# Patient Record
Sex: Male | Born: 1950 | Race: White | Hispanic: No | Marital: Married | Smoking: Former smoker
Health system: Southern US, Community
[De-identification: ages and names within clinical notes are randomized; demographics above are authoritative.]

## PROBLEM LIST (undated history)

## (undated) DIAGNOSIS — Z809 Family history of malignant neoplasm, unspecified: Secondary | ICD-10-CM

## (undated) DIAGNOSIS — N419 Inflammatory disease of prostate, unspecified: Secondary | ICD-10-CM

## (undated) DIAGNOSIS — M545 Low back pain, unspecified: Secondary | ICD-10-CM

## (undated) DIAGNOSIS — J302 Other seasonal allergic rhinitis: Secondary | ICD-10-CM

## (undated) DIAGNOSIS — K589 Irritable bowel syndrome without diarrhea: Secondary | ICD-10-CM

## (undated) DIAGNOSIS — G4733 Obstructive sleep apnea (adult) (pediatric): Secondary | ICD-10-CM

## (undated) DIAGNOSIS — G8929 Other chronic pain: Secondary | ICD-10-CM

## (undated) DIAGNOSIS — K449 Diaphragmatic hernia without obstruction or gangrene: Secondary | ICD-10-CM

## (undated) DIAGNOSIS — T8182XA Emphysema (subcutaneous) resulting from a procedure, initial encounter: Secondary | ICD-10-CM

## (undated) DIAGNOSIS — H409 Unspecified glaucoma: Secondary | ICD-10-CM

## (undated) DIAGNOSIS — K760 Fatty (change of) liver, not elsewhere classified: Secondary | ICD-10-CM

## (undated) DIAGNOSIS — H9190 Unspecified hearing loss, unspecified ear: Secondary | ICD-10-CM

## (undated) DIAGNOSIS — K5790 Diverticulosis of intestine, part unspecified, without perforation or abscess without bleeding: Secondary | ICD-10-CM

## (undated) HISTORY — DX: Other seasonal allergic rhinitis: J30.2

## (undated) HISTORY — DX: Fatty (change of) liver, not elsewhere classified: K76.0

## (undated) HISTORY — DX: Low back pain: M54.5

## (undated) HISTORY — DX: Other chronic pain: G89.29

## (undated) HISTORY — DX: Inflammatory disease of prostate, unspecified: N41.9

## (undated) HISTORY — DX: Unspecified glaucoma: H40.9

## (undated) HISTORY — DX: Family history of malignant neoplasm, unspecified: Z80.9

## (undated) HISTORY — DX: Obstructive sleep apnea (adult) (pediatric): G47.33

## (undated) HISTORY — PX: OTHER SURGICAL HISTORY: SHX169

## (undated) HISTORY — DX: Diverticulosis of intestine, part unspecified, without perforation or abscess without bleeding: K57.90

## (undated) HISTORY — DX: Low back pain, unspecified: M54.50

## (undated) HISTORY — DX: Irritable bowel syndrome, unspecified: K58.9

## (undated) HISTORY — DX: Diaphragmatic hernia without obstruction or gangrene: K44.9

## (undated) HISTORY — DX: Emphysema (subcutaneous) resulting from a procedure, initial encounter: T81.82XA

## (undated) HISTORY — DX: Unspecified hearing loss, unspecified ear: H91.90

---

## 2012-10-13 HISTORY — PX: COLONOSCOPY: SHX5424

## 2013-09-18 DIAGNOSIS — IMO0002 Reserved for concepts with insufficient information to code with codable children: Secondary | ICD-10-CM | POA: Insufficient documentation

## 2013-09-18 DIAGNOSIS — H4423 Degenerative myopia, bilateral: Secondary | ICD-10-CM | POA: Insufficient documentation

## 2014-12-28 ENCOUNTER — Institutional Professional Consult (permissible substitution): Payer: BLUE CROSS/BLUE SHIELD | Admitting: Neurology

## 2014-12-28 ENCOUNTER — Telehealth: Payer: Self-pay

## 2014-12-28 NOTE — Telephone Encounter (Signed)
Dr. Brett Fairy out of office today. Called pt to r/s. Appt made 01/19/15 at 9:30 with pt.

## 2015-01-19 ENCOUNTER — Institutional Professional Consult (permissible substitution): Payer: Self-pay | Admitting: Neurology

## 2015-02-14 ENCOUNTER — Other Ambulatory Visit: Payer: Self-pay | Admitting: Internal Medicine

## 2015-02-14 DIAGNOSIS — R918 Other nonspecific abnormal finding of lung field: Secondary | ICD-10-CM

## 2015-02-14 DIAGNOSIS — K588 Other irritable bowel syndrome: Secondary | ICD-10-CM

## 2015-02-14 DIAGNOSIS — N281 Cyst of kidney, acquired: Secondary | ICD-10-CM

## 2015-02-21 ENCOUNTER — Other Ambulatory Visit: Payer: Self-pay

## 2015-02-23 ENCOUNTER — Inpatient Hospital Stay: Admission: RE | Admit: 2015-02-23 | Payer: Self-pay | Source: Ambulatory Visit

## 2015-02-23 ENCOUNTER — Ambulatory Visit
Admission: RE | Admit: 2015-02-23 | Discharge: 2015-02-23 | Disposition: A | Discharge status: Home / Self Care | Payer: BLUE CROSS/BLUE SHIELD | Source: Ambulatory Visit | Attending: Internal Medicine | Admitting: Internal Medicine

## 2015-02-23 DIAGNOSIS — K588 Other irritable bowel syndrome: Secondary | ICD-10-CM

## 2015-02-23 DIAGNOSIS — N281 Cyst of kidney, acquired: Secondary | ICD-10-CM

## 2015-02-28 ENCOUNTER — Other Ambulatory Visit: Payer: Self-pay | Admitting: Internal Medicine

## 2015-02-28 DIAGNOSIS — R918 Other nonspecific abnormal finding of lung field: Secondary | ICD-10-CM

## 2015-03-01 ENCOUNTER — Ambulatory Visit
Admission: RE | Admit: 2015-03-01 | Discharge: 2015-03-01 | Disposition: A | Discharge status: Home / Self Care | Payer: BLUE CROSS/BLUE SHIELD | Source: Ambulatory Visit | Attending: Internal Medicine | Admitting: Internal Medicine

## 2015-03-01 ENCOUNTER — Other Ambulatory Visit: Payer: Self-pay | Admitting: Internal Medicine

## 2015-03-01 ENCOUNTER — Inpatient Hospital Stay
Admission: RE | Admit: 2015-03-01 | Discharge: 2015-03-01 | Disposition: A | Discharge status: Home / Self Care | Payer: Self-pay | Source: Ambulatory Visit | Attending: Internal Medicine | Admitting: Internal Medicine

## 2015-03-01 DIAGNOSIS — R918 Other nonspecific abnormal finding of lung field: Secondary | ICD-10-CM

## 2015-11-11 DIAGNOSIS — H401132 Primary open-angle glaucoma, bilateral, moderate stage: Secondary | ICD-10-CM | POA: Insufficient documentation

## 2016-02-16 ENCOUNTER — Other Ambulatory Visit: Payer: Self-pay | Admitting: Internal Medicine

## 2016-02-16 DIAGNOSIS — E041 Nontoxic single thyroid nodule: Secondary | ICD-10-CM

## 2016-02-22 ENCOUNTER — Ambulatory Visit
Admission: RE | Admit: 2016-02-22 | Discharge: 2016-02-22 | Disposition: A | Discharge status: Home / Self Care | Payer: BLUE CROSS/BLUE SHIELD | Source: Ambulatory Visit | Attending: Internal Medicine | Admitting: Internal Medicine

## 2016-02-22 DIAGNOSIS — E041 Nontoxic single thyroid nodule: Secondary | ICD-10-CM

## 2016-02-26 ENCOUNTER — Other Ambulatory Visit: Payer: Self-pay | Admitting: Surgery

## 2016-02-26 ENCOUNTER — Ambulatory Visit: Payer: Self-pay | Admitting: Surgery

## 2016-02-26 DIAGNOSIS — E041 Nontoxic single thyroid nodule: Secondary | ICD-10-CM

## 2016-02-26 NOTE — H&P (Signed)
Mark Travis 02/26/2016 11:03 AM Location: [facility] Patient #: [id number] DOB: 12-18-1950 Married / Language: Mark Travis / Race: White Male  History of Present Illness Mark Travis A. Mark Bodmer MD; 02/26/2016 12:24 PM) Patient words: thyroid mass and sebaceous cyst on neck Patient sent at the request of Dr. Rolena Travis for a lump on the patient's posterior neck. He was seen for neck pain and underwent a CT scan which showed a sebaceous cyst on his posterior neck and a 2 cm mass in the isthmus of the thyroid gland. He underwent ultrasound of his thyroid gland which shows a 2.6 cm mass in the isthmus the thyroid. FNA was recommended. Denies any hoarseness, difficulty swallowing, dysphagia or globus. The cyst on his posterior neck is potential for getting larger. Denies any redness or drainage from it. It's been present for over a year.             CLINICAL DATA: Incidental on MRI.  EXAM: THYROID ULTRASOUND  TECHNIQUE: Ultrasound examination of the thyroid gland and adjacent soft tissues was performed.  COMPARISON: None.  FINDINGS: Parenchymal Echotexture: Mildly heterogenous  Estimated total number of nodules >/= 1 cm: 2  Number of spongiform nodules >/= 2 cm not described below (TR1): 0  Number of mixed cystic and solid nodules >/= 1.5 cm not described below ([place]): 0  _________________________________________________________  Isthmus: 0.8 cm  Nodule # 1:  Location: Isthmus; Inferior  Size: 2.6 x 1.7 x 2.2 cm  Composition: solid/almost completely solid (2)  Echogenicity: isoechoic (1)  Shape: not taller-than-wide (0)  Margins: smooth (0)  Echogenic foci: none (0)  ACR TI-RADS total points: 3.  ACR TI-RADS risk category: TR3 (3 points).  ACR TI-RADS recommendations:  **Given size (>/= 2.5 cm) and appearance, fine needle aspiration of this mildly suspicious nodule should be considered based on  TI-RADS criteria.  _________________________________________________________  Right lobe: 5.5 x 1.8 x 2.1 cm  Nodule # 2:  Location: Right; Inferior  Size: 2.0 x 1.9 x 1.8 cm  Composition: solid/almost completely solid (2)  Echogenicity: isoechoic (1)  Shape: not taller-than-wide (0)  Margins: smooth (0)  Echogenic foci: none (0)  ACR TI-RADS total points: 3.  ACR TI-RADS risk category: TR3 (3 points).  ACR TI-RADS recommendations:  *Given size (>/= 1.5 - 2.4 cm) and appearance, a follow-up ultrasound in 1 year should be considered based on TI-RADS criteria.  Sub cm nodules have a benign appearance.  _________________________________________________________  Left lobe: 4.7 x 1.7 x 2.1 cm  0.8 cm lower pole cyst has a benign appearance.  IMPRESSION: 2.6 cm nodule in the isthmus. Fine-needle aspiration biopsy is recommended  2.0 cm right lower pole nodule. Annual follow-up is recommended.  The above is in keeping with the ACR TI-RADS recommendations - J Am Coll Radiol 2017;14:587-595.   Electronically Signed By: Mark Travis M.D. On: 02/22/2016 15:20.  The patient is a 65 year old male.   Other Problems Mark Travis, Mark Travis; 02/26/2016 11:03 AM) Arthritis Back Pain Diabetes Mellitus Diverticulosis  Past Surgical History Mark Travis, [place]; 02/26/2016 11:03 AM) No pertinent past surgical history  Diagnostic Studies History Mark Travis, Mark Travis; 02/26/2016 11:03 AM) Colonoscopy 1-5 years ago  Allergies Mark Travis, Mark Travis; 02/26/2016 11:04 AM) No Known Drug Allergies 02/26/2016  Medication History (Mark Travis, Mark Travis; 02/26/2016 11:05 AM) Timolol Maleate (0.5% Solution, Ophthalmic) Active. Nortriptyline HCl (25MG  Capsule, Oral) Active. MetFORMIN HCl ER (500MG  Tablet ER 24HR, Oral) Active. TiZANidine HCl (4MG  Tablet, Oral) Active. Ciprofloxacin HCl (500MG  Tablet, Oral) Active. Probiotic Childrens (  Oral) Active. B Complex 50 (Oral)  Active. Medications Reconciled  Social History (Mark Travis; 02/26/2016 11:03 AM) Alcohol use Occasional alcohol use. Caffeine use Coffee. No drug use Tobacco use Current every day smoker.  Family History Mark Travis, Mark Travis; 02/26/2016 11:03 AM) Arthritis Mother. Heart Disease Mother. Heart disease in male family member before age 23 Hypertension Mother.     Review of Systems Mark Travis Mark Travis; 02/26/2016 11:03 AM) General Not Present- Appetite Loss, Chills, Fatigue, Fever, Night Sweats, Weight Gain and Weight Loss. Skin Not Present- Change in Wart/Mole, Dryness, Hives, Jaundice, New Lesions, Non-Healing Wounds, Rash and Ulcer. HEENT Present- Hearing Loss, Ringing in the Ears, Seasonal Allergies and Wears glasses/contact lenses. Not Present- Earache, Hoarseness, Nose Bleed, Oral Ulcers, Sinus Pain, Sore Throat, Visual Disturbances and Yellow Eyes. Respiratory Present- Snoring. Not Present- Bloody sputum, Chronic Cough, Difficulty Breathing and Wheezing. Breast Not Present- Breast Mass, Breast Pain, Nipple Discharge and Skin Changes. Cardiovascular Not Present- Chest Pain, Difficulty Breathing Lying Down, Leg Cramps, Palpitations, Rapid Heart Rate, Shortness of Breath and Swelling of Extremities. Gastrointestinal Not Present- Abdominal Pain, Bloating, Bloody Stool, Change in Bowel Habits, Chronic diarrhea, Constipation, Difficulty Swallowing, Excessive gas, Gets full quickly at meals, Hemorrhoids, Indigestion, Nausea, Rectal Pain and Vomiting. Male Genitourinary Not Present- Blood in Urine, Change in Urinary Stream, Frequency, Impotence, Nocturia, Painful Urination, Urgency and Urine Leakage. Musculoskeletal Present- Back Pain, Joint Pain, Joint Stiffness and Muscle Weakness. Not Present- Muscle Pain and Swelling of Extremities. Neurological Present- Tingling and Trouble walking. Not Present- Decreased Memory, Fainting, Headaches, Numbness, Seizures, Tremor and  Weakness. Psychiatric Not Present- Anxiety, Bipolar, Change in Sleep Pattern, Depression, Fearful and Frequent crying. Endocrine Present- New Diabetes. Not Present- Cold Intolerance, Excessive Hunger, Hair Changes, Heat Intolerance and Hot flashes. Hematology Not Present- Blood Thinners, Easy Bruising, Excessive bleeding, Gland problems, HIV and Persistent Infections.  Vitals (Mark Travis Mark Travis; 02/26/2016 11:04 AM) 02/26/2016 11:04 AM Weight: 190 lb Height: 70in Body Surface Area: 2.04 m Body Mass Index: 27.26 kg/m  Temp.: 41F(Temporal)  Pulse: 75 (Regular)  BP: 124/80 (Sitting, Left Arm, Standard)      Physical Exam (Ying Blankenhorn A. Sherrilyn Nairn MD; 02/26/2016 12:25 PM)  General Mental Status-Alert. General Appearance-Consistent with stated age. Hydration-Well hydrated. Voice-Normal.  Integumentary Note: 3 cm mass mobile posterior neck consistent with epidermal inclusion cyst. Not infected.  ENMT Note: Thyroid normal size. No dominant mass. Trachea midline. Voice is normal.  Chest and Lung Exam Chest and lung exam reveals -quiet, even and easy respiratory effort with no use of accessory muscles and on auscultation, normal breath sounds, no adventitious sounds and normal vocal resonance. Inspection Chest Wall - Normal. Back - normal.  Cardiovascular Cardiovascular examination reveals -normal heart sounds, regular rate and rhythm with no murmurs and normal pedal pulses bilaterally.  Musculoskeletal Normal Exam - Left-Upper Extremity Strength Normal and Lower Extremity Strength Normal. Normal Exam - Right-Upper Extremity Strength Normal and Lower Extremity Strength Normal.  Lymphatic Head & Neck  General Head & Neck Lymphatics: Bilateral - Description - Normal.    Assessment & Plan (Hydee Fleece A. 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  You should hear from our office's scheduling department  within 5 working days about the location, date, and time of surgery. We try to make accommodations for patient's preferences in scheduling surgery, but sometimes the OR schedule or the surgeon's schedule prevents [place] from making those accommodations.  If you have not heard from our office [phone]) in 5 working days, call the office and ask for your surgeon's nurse.  If you have other questions about your diagnosis, plan, or surgery, call the office and ask for your surgeon's nurse.  The pathophysiology of skin & subcutaneous masses was discussed. Natural history risks without surgery were discussed. I recommended surgery to remove the mass. I explained the technique of removal with use of local anesthesia & possible need for more aggressive sedation/anesthesia for patient comfort.  Risks such as bleeding, infection, wound breakdown, heart attack, death, and other risks were discussed. I noted a good likelihood this will help address the problem. Possibility that this will not correct all symptoms was explained. Possibility of regrowth/recurrence of the mass was discussed. We will work to minimize complications. Questions were answered. The patient expresses understanding & wishes to proceed with surgery.

## 2016-03-12 ENCOUNTER — Other Ambulatory Visit (HOSPITAL_COMMUNITY)
Admission: RE | Admit: 2016-03-12 | Discharge: 2016-03-12 | Disposition: A | Discharge status: Home / Self Care | Payer: BLUE CROSS/BLUE SHIELD | Source: Ambulatory Visit | Attending: Radiology | Admitting: Radiology

## 2016-03-12 ENCOUNTER — Ambulatory Visit
Admission: RE | Admit: 2016-03-12 | Discharge: 2016-03-12 | Disposition: A | Discharge status: Home / Self Care | Payer: BLUE CROSS/BLUE SHIELD | Source: Ambulatory Visit | Attending: Surgery | Admitting: Surgery

## 2016-03-12 DIAGNOSIS — E041 Nontoxic single thyroid nodule: Secondary | ICD-10-CM

## 2017-03-20 DIAGNOSIS — R141 Gas pain: Secondary | ICD-10-CM | POA: Diagnosis not present

## 2017-03-27 DIAGNOSIS — E1151 Type 2 diabetes mellitus with diabetic peripheral angiopathy without gangrene: Secondary | ICD-10-CM | POA: Diagnosis not present

## 2017-03-27 DIAGNOSIS — R82998 Other abnormal findings in urine: Secondary | ICD-10-CM | POA: Diagnosis not present

## 2017-03-27 DIAGNOSIS — E041 Nontoxic single thyroid nodule: Secondary | ICD-10-CM | POA: Diagnosis not present

## 2017-03-27 DIAGNOSIS — E7849 Other hyperlipidemia: Secondary | ICD-10-CM | POA: Diagnosis not present

## 2017-03-27 DIAGNOSIS — Z125 Encounter for screening for malignant neoplasm of prostate: Secondary | ICD-10-CM | POA: Diagnosis not present

## 2017-03-31 DIAGNOSIS — M222X1 Patellofemoral disorders, right knee: Secondary | ICD-10-CM | POA: Diagnosis not present

## 2017-03-31 DIAGNOSIS — Z0000 Encounter for general adult medical examination without abnormal findings: Secondary | ICD-10-CM | POA: Diagnosis not present

## 2017-03-31 DIAGNOSIS — I2510 Atherosclerotic heart disease of native coronary artery without angina pectoris: Secondary | ICD-10-CM | POA: Diagnosis not present

## 2017-03-31 DIAGNOSIS — E7849 Other hyperlipidemia: Secondary | ICD-10-CM | POA: Diagnosis not present

## 2017-03-31 DIAGNOSIS — E1151 Type 2 diabetes mellitus with diabetic peripheral angiopathy without gangrene: Secondary | ICD-10-CM | POA: Diagnosis not present

## 2017-03-31 DIAGNOSIS — R972 Elevated prostate specific antigen [PSA]: Secondary | ICD-10-CM | POA: Diagnosis not present

## 2017-03-31 DIAGNOSIS — D569 Thalassemia, unspecified: Secondary | ICD-10-CM | POA: Diagnosis not present

## 2017-03-31 DIAGNOSIS — M545 Low back pain: Secondary | ICD-10-CM | POA: Diagnosis not present

## 2017-03-31 DIAGNOSIS — M542 Cervicalgia: Secondary | ICD-10-CM | POA: Diagnosis not present

## 2017-03-31 DIAGNOSIS — M222X2 Patellofemoral disorders, left knee: Secondary | ICD-10-CM | POA: Diagnosis not present

## 2017-03-31 DIAGNOSIS — I7389 Other specified peripheral vascular diseases: Secondary | ICD-10-CM | POA: Diagnosis not present

## 2017-04-01 DIAGNOSIS — Z1212 Encounter for screening for malignant neoplasm of rectum: Secondary | ICD-10-CM | POA: Diagnosis not present

## 2017-04-02 ENCOUNTER — Other Ambulatory Visit: Payer: Self-pay | Admitting: Internal Medicine

## 2017-04-02 DIAGNOSIS — F17210 Nicotine dependence, cigarettes, uncomplicated: Secondary | ICD-10-CM

## 2017-04-16 DIAGNOSIS — M222X1 Patellofemoral disorders, right knee: Secondary | ICD-10-CM | POA: Insufficient documentation

## 2017-04-16 DIAGNOSIS — M222X2 Patellofemoral disorders, left knee: Secondary | ICD-10-CM | POA: Insufficient documentation

## 2017-04-17 ENCOUNTER — Ambulatory Visit: Payer: BLUE CROSS/BLUE SHIELD

## 2017-04-21 ENCOUNTER — Ambulatory Visit: Payer: Self-pay

## 2017-04-22 ENCOUNTER — Ambulatory Visit
Admission: RE | Admit: 2017-04-22 | Discharge: 2017-04-22 | Disposition: A | Discharge status: Home / Self Care | Payer: Medicare PPO | Source: Ambulatory Visit | Attending: Internal Medicine | Admitting: Internal Medicine

## 2017-04-22 DIAGNOSIS — F17210 Nicotine dependence, cigarettes, uncomplicated: Secondary | ICD-10-CM

## 2017-04-24 DIAGNOSIS — M222X2 Patellofemoral disorders, left knee: Secondary | ICD-10-CM | POA: Diagnosis not present

## 2017-04-24 DIAGNOSIS — M222X1 Patellofemoral disorders, right knee: Secondary | ICD-10-CM | POA: Diagnosis not present

## 2017-04-30 DIAGNOSIS — R141 Gas pain: Secondary | ICD-10-CM | POA: Diagnosis not present

## 2017-05-01 DIAGNOSIS — M222X2 Patellofemoral disorders, left knee: Secondary | ICD-10-CM | POA: Diagnosis not present

## 2017-05-01 DIAGNOSIS — M222X1 Patellofemoral disorders, right knee: Secondary | ICD-10-CM | POA: Diagnosis not present

## 2017-05-07 DIAGNOSIS — M222X1 Patellofemoral disorders, right knee: Secondary | ICD-10-CM | POA: Diagnosis not present

## 2017-05-07 DIAGNOSIS — M222X2 Patellofemoral disorders, left knee: Secondary | ICD-10-CM | POA: Diagnosis not present

## 2017-05-08 DIAGNOSIS — H401132 Primary open-angle glaucoma, bilateral, moderate stage: Secondary | ICD-10-CM | POA: Diagnosis not present

## 2017-05-12 DIAGNOSIS — M222X1 Patellofemoral disorders, right knee: Secondary | ICD-10-CM | POA: Diagnosis not present

## 2017-05-12 DIAGNOSIS — M222X2 Patellofemoral disorders, left knee: Secondary | ICD-10-CM | POA: Diagnosis not present

## 2017-05-19 DIAGNOSIS — H401132 Primary open-angle glaucoma, bilateral, moderate stage: Secondary | ICD-10-CM | POA: Diagnosis not present

## 2017-07-07 DIAGNOSIS — H401132 Primary open-angle glaucoma, bilateral, moderate stage: Secondary | ICD-10-CM | POA: Diagnosis not present

## 2017-08-11 DIAGNOSIS — H401132 Primary open-angle glaucoma, bilateral, moderate stage: Secondary | ICD-10-CM | POA: Diagnosis not present

## 2017-08-29 DIAGNOSIS — M222X2 Patellofemoral disorders, left knee: Secondary | ICD-10-CM | POA: Diagnosis not present

## 2017-09-04 DIAGNOSIS — R05 Cough: Secondary | ICD-10-CM | POA: Diagnosis not present

## 2017-09-04 DIAGNOSIS — Z6826 Body mass index (BMI) 26.0-26.9, adult: Secondary | ICD-10-CM | POA: Diagnosis not present

## 2017-09-04 DIAGNOSIS — J069 Acute upper respiratory infection, unspecified: Secondary | ICD-10-CM | POA: Diagnosis not present

## 2017-10-12 DIAGNOSIS — H409 Unspecified glaucoma: Secondary | ICD-10-CM | POA: Diagnosis not present

## 2017-10-12 DIAGNOSIS — Z7984 Long term (current) use of oral hypoglycemic drugs: Secondary | ICD-10-CM | POA: Diagnosis not present

## 2017-10-12 DIAGNOSIS — F17200 Nicotine dependence, unspecified, uncomplicated: Secondary | ICD-10-CM | POA: Diagnosis not present

## 2017-10-12 DIAGNOSIS — H9190 Unspecified hearing loss, unspecified ear: Secondary | ICD-10-CM | POA: Diagnosis not present

## 2017-10-12 DIAGNOSIS — M545 Low back pain: Secondary | ICD-10-CM | POA: Diagnosis not present

## 2017-10-12 DIAGNOSIS — E663 Overweight: Secondary | ICD-10-CM | POA: Diagnosis not present

## 2017-10-12 DIAGNOSIS — J449 Chronic obstructive pulmonary disease, unspecified: Secondary | ICD-10-CM | POA: Diagnosis not present

## 2017-10-12 DIAGNOSIS — E119 Type 2 diabetes mellitus without complications: Secondary | ICD-10-CM | POA: Diagnosis not present

## 2017-10-12 DIAGNOSIS — Z6825 Body mass index (BMI) 25.0-25.9, adult: Secondary | ICD-10-CM | POA: Diagnosis not present

## 2017-11-13 DIAGNOSIS — H401132 Primary open-angle glaucoma, bilateral, moderate stage: Secondary | ICD-10-CM | POA: Diagnosis not present

## 2017-12-17 DIAGNOSIS — Z6826 Body mass index (BMI) 26.0-26.9, adult: Secondary | ICD-10-CM | POA: Diagnosis not present

## 2017-12-17 DIAGNOSIS — R300 Dysuria: Secondary | ICD-10-CM | POA: Diagnosis not present

## 2017-12-17 DIAGNOSIS — Z87898 Personal history of other specified conditions: Secondary | ICD-10-CM | POA: Diagnosis not present

## 2017-12-30 DIAGNOSIS — R351 Nocturia: Secondary | ICD-10-CM | POA: Diagnosis not present

## 2017-12-30 DIAGNOSIS — R3915 Urgency of urination: Secondary | ICD-10-CM | POA: Diagnosis not present

## 2018-01-07 DIAGNOSIS — H401132 Primary open-angle glaucoma, bilateral, moderate stage: Secondary | ICD-10-CM | POA: Diagnosis not present

## 2018-01-30 DIAGNOSIS — H401132 Primary open-angle glaucoma, bilateral, moderate stage: Secondary | ICD-10-CM | POA: Diagnosis not present

## 2018-02-27 DIAGNOSIS — J069 Acute upper respiratory infection, unspecified: Secondary | ICD-10-CM | POA: Diagnosis not present

## 2018-02-27 DIAGNOSIS — Z6827 Body mass index (BMI) 27.0-27.9, adult: Secondary | ICD-10-CM | POA: Diagnosis not present

## 2018-02-27 DIAGNOSIS — R05 Cough: Secondary | ICD-10-CM | POA: Diagnosis not present

## 2018-03-19 DIAGNOSIS — M222X2 Patellofemoral disorders, left knee: Secondary | ICD-10-CM | POA: Diagnosis not present

## 2018-03-19 DIAGNOSIS — M25562 Pain in left knee: Secondary | ICD-10-CM | POA: Diagnosis not present

## 2018-03-20 ENCOUNTER — Other Ambulatory Visit: Payer: Self-pay | Admitting: Orthopedic Surgery

## 2018-03-20 DIAGNOSIS — M222X2 Patellofemoral disorders, left knee: Secondary | ICD-10-CM

## 2018-03-24 DIAGNOSIS — N401 Enlarged prostate with lower urinary tract symptoms: Secondary | ICD-10-CM | POA: Diagnosis not present

## 2018-03-24 DIAGNOSIS — R3915 Urgency of urination: Secondary | ICD-10-CM | POA: Diagnosis not present

## 2018-03-26 DIAGNOSIS — E7849 Other hyperlipidemia: Secondary | ICD-10-CM | POA: Diagnosis not present

## 2018-03-26 DIAGNOSIS — E041 Nontoxic single thyroid nodule: Secondary | ICD-10-CM | POA: Diagnosis not present

## 2018-03-26 DIAGNOSIS — E1151 Type 2 diabetes mellitus with diabetic peripheral angiopathy without gangrene: Secondary | ICD-10-CM | POA: Diagnosis not present

## 2018-03-26 DIAGNOSIS — R82998 Other abnormal findings in urine: Secondary | ICD-10-CM | POA: Diagnosis not present

## 2018-03-26 DIAGNOSIS — Z125 Encounter for screening for malignant neoplasm of prostate: Secondary | ICD-10-CM | POA: Diagnosis not present

## 2018-04-01 ENCOUNTER — Ambulatory Visit
Admission: RE | Admit: 2018-04-01 | Discharge: 2018-04-01 | Disposition: A | Discharge status: Home / Self Care | Payer: Medicare PPO | Source: Ambulatory Visit | Attending: Orthopedic Surgery | Admitting: Orthopedic Surgery

## 2018-04-01 DIAGNOSIS — H04123 Dry eye syndrome of bilateral lacrimal glands: Secondary | ICD-10-CM | POA: Diagnosis not present

## 2018-04-01 DIAGNOSIS — H16141 Punctate keratitis, right eye: Secondary | ICD-10-CM | POA: Diagnosis not present

## 2018-04-01 DIAGNOSIS — M222X2 Patellofemoral disorders, left knee: Secondary | ICD-10-CM

## 2018-04-01 DIAGNOSIS — M25562 Pain in left knee: Secondary | ICD-10-CM | POA: Diagnosis not present

## 2018-04-02 DIAGNOSIS — I10 Essential (primary) hypertension: Secondary | ICD-10-CM | POA: Diagnosis not present

## 2018-04-02 DIAGNOSIS — Z0000 Encounter for general adult medical examination without abnormal findings: Secondary | ICD-10-CM | POA: Diagnosis not present

## 2018-04-02 DIAGNOSIS — I7389 Other specified peripheral vascular diseases: Secondary | ICD-10-CM | POA: Diagnosis not present

## 2018-04-02 DIAGNOSIS — I2510 Atherosclerotic heart disease of native coronary artery without angina pectoris: Secondary | ICD-10-CM | POA: Diagnosis not present

## 2018-04-02 DIAGNOSIS — M542 Cervicalgia: Secondary | ICD-10-CM | POA: Diagnosis not present

## 2018-04-02 DIAGNOSIS — E1151 Type 2 diabetes mellitus with diabetic peripheral angiopathy without gangrene: Secondary | ICD-10-CM | POA: Diagnosis not present

## 2018-04-02 DIAGNOSIS — R05 Cough: Secondary | ICD-10-CM | POA: Diagnosis not present

## 2018-04-02 DIAGNOSIS — F17210 Nicotine dependence, cigarettes, uncomplicated: Secondary | ICD-10-CM | POA: Diagnosis not present

## 2018-04-02 DIAGNOSIS — E7849 Other hyperlipidemia: Secondary | ICD-10-CM | POA: Diagnosis not present

## 2018-04-03 DIAGNOSIS — Z1212 Encounter for screening for malignant neoplasm of rectum: Secondary | ICD-10-CM | POA: Diagnosis not present

## 2018-04-06 ENCOUNTER — Other Ambulatory Visit: Payer: Self-pay | Admitting: Internal Medicine

## 2018-04-06 DIAGNOSIS — Z0000 Encounter for general adult medical examination without abnormal findings: Secondary | ICD-10-CM

## 2018-04-06 DIAGNOSIS — F17210 Nicotine dependence, cigarettes, uncomplicated: Secondary | ICD-10-CM

## 2018-04-16 DIAGNOSIS — E1151 Type 2 diabetes mellitus with diabetic peripheral angiopathy without gangrene: Secondary | ICD-10-CM | POA: Diagnosis not present

## 2018-04-16 DIAGNOSIS — J069 Acute upper respiratory infection, unspecified: Secondary | ICD-10-CM | POA: Diagnosis not present

## 2018-04-16 DIAGNOSIS — Z6827 Body mass index (BMI) 27.0-27.9, adult: Secondary | ICD-10-CM | POA: Diagnosis not present

## 2018-04-16 DIAGNOSIS — R52 Pain, unspecified: Secondary | ICD-10-CM | POA: Diagnosis not present

## 2018-04-22 DIAGNOSIS — E119 Type 2 diabetes mellitus without complications: Secondary | ICD-10-CM | POA: Diagnosis not present

## 2018-04-22 DIAGNOSIS — R141 Gas pain: Secondary | ICD-10-CM | POA: Diagnosis not present

## 2018-04-22 DIAGNOSIS — H04123 Dry eye syndrome of bilateral lacrimal glands: Secondary | ICD-10-CM | POA: Diagnosis not present

## 2018-04-22 DIAGNOSIS — K5730 Diverticulosis of large intestine without perforation or abscess without bleeding: Secondary | ICD-10-CM | POA: Diagnosis not present

## 2018-04-22 DIAGNOSIS — H16141 Punctate keratitis, right eye: Secondary | ICD-10-CM | POA: Diagnosis not present

## 2018-04-22 DIAGNOSIS — Z1211 Encounter for screening for malignant neoplasm of colon: Secondary | ICD-10-CM | POA: Diagnosis not present

## 2018-04-27 ENCOUNTER — Ambulatory Visit
Admission: RE | Admit: 2018-04-27 | Discharge: 2018-04-27 | Disposition: A | Discharge status: Home / Self Care | Payer: Medicare PPO | Source: Ambulatory Visit | Attending: Internal Medicine | Admitting: Internal Medicine

## 2018-04-27 DIAGNOSIS — F17210 Nicotine dependence, cigarettes, uncomplicated: Secondary | ICD-10-CM

## 2018-04-27 DIAGNOSIS — Z0000 Encounter for general adult medical examination without abnormal findings: Secondary | ICD-10-CM

## 2018-04-30 DIAGNOSIS — S39012A Strain of muscle, fascia and tendon of lower back, initial encounter: Secondary | ICD-10-CM | POA: Diagnosis not present

## 2018-04-30 DIAGNOSIS — M9903 Segmental and somatic dysfunction of lumbar region: Secondary | ICD-10-CM | POA: Diagnosis not present

## 2018-04-30 DIAGNOSIS — M9902 Segmental and somatic dysfunction of thoracic region: Secondary | ICD-10-CM | POA: Diagnosis not present

## 2018-04-30 DIAGNOSIS — M9901 Segmental and somatic dysfunction of cervical region: Secondary | ICD-10-CM | POA: Diagnosis not present

## 2018-04-30 DIAGNOSIS — M47812 Spondylosis without myelopathy or radiculopathy, cervical region: Secondary | ICD-10-CM | POA: Diagnosis not present

## 2018-04-30 DIAGNOSIS — S29012A Strain of muscle and tendon of back wall of thorax, initial encounter: Secondary | ICD-10-CM | POA: Diagnosis not present

## 2018-05-11 DIAGNOSIS — Z1212 Encounter for screening for malignant neoplasm of rectum: Secondary | ICD-10-CM | POA: Diagnosis not present

## 2018-05-11 DIAGNOSIS — Z1211 Encounter for screening for malignant neoplasm of colon: Secondary | ICD-10-CM | POA: Diagnosis not present

## 2018-05-14 DIAGNOSIS — M222X2 Patellofemoral disorders, left knee: Secondary | ICD-10-CM | POA: Diagnosis not present

## 2018-05-27 DIAGNOSIS — R972 Elevated prostate specific antigen [PSA]: Secondary | ICD-10-CM | POA: Insufficient documentation

## 2018-05-29 DIAGNOSIS — R350 Frequency of micturition: Secondary | ICD-10-CM | POA: Diagnosis not present

## 2018-05-29 DIAGNOSIS — R972 Elevated prostate specific antigen [PSA]: Secondary | ICD-10-CM | POA: Diagnosis not present

## 2018-05-29 DIAGNOSIS — E119 Type 2 diabetes mellitus without complications: Secondary | ICD-10-CM | POA: Diagnosis not present

## 2018-08-14 DIAGNOSIS — I10 Essential (primary) hypertension: Secondary | ICD-10-CM | POA: Diagnosis not present

## 2018-08-14 DIAGNOSIS — E785 Hyperlipidemia, unspecified: Secondary | ICD-10-CM | POA: Diagnosis not present

## 2018-08-14 DIAGNOSIS — I2510 Atherosclerotic heart disease of native coronary artery without angina pectoris: Secondary | ICD-10-CM | POA: Diagnosis not present

## 2018-08-14 DIAGNOSIS — E1151 Type 2 diabetes mellitus with diabetic peripheral angiopathy without gangrene: Secondary | ICD-10-CM | POA: Diagnosis not present

## 2018-09-15 DIAGNOSIS — D1801 Hemangioma of skin and subcutaneous tissue: Secondary | ICD-10-CM | POA: Diagnosis not present

## 2018-09-15 DIAGNOSIS — D225 Melanocytic nevi of trunk: Secondary | ICD-10-CM | POA: Diagnosis not present

## 2018-09-15 DIAGNOSIS — L570 Actinic keratosis: Secondary | ICD-10-CM | POA: Diagnosis not present

## 2018-09-15 DIAGNOSIS — L821 Other seborrheic keratosis: Secondary | ICD-10-CM | POA: Diagnosis not present

## 2018-09-15 DIAGNOSIS — D2372 Other benign neoplasm of skin of left lower limb, including hip: Secondary | ICD-10-CM | POA: Diagnosis not present

## 2018-09-15 DIAGNOSIS — D1722 Benign lipomatous neoplasm of skin and subcutaneous tissue of left arm: Secondary | ICD-10-CM | POA: Diagnosis not present

## 2018-09-15 DIAGNOSIS — Z85828 Personal history of other malignant neoplasm of skin: Secondary | ICD-10-CM | POA: Diagnosis not present

## 2018-09-23 DIAGNOSIS — N401 Enlarged prostate with lower urinary tract symptoms: Secondary | ICD-10-CM | POA: Diagnosis not present

## 2018-09-23 DIAGNOSIS — R351 Nocturia: Secondary | ICD-10-CM | POA: Diagnosis not present

## 2018-09-23 DIAGNOSIS — R399 Unspecified symptoms and signs involving the genitourinary system: Secondary | ICD-10-CM | POA: Diagnosis not present

## 2018-09-23 DIAGNOSIS — R972 Elevated prostate specific antigen [PSA]: Secondary | ICD-10-CM | POA: Diagnosis not present

## 2018-09-23 DIAGNOSIS — R350 Frequency of micturition: Secondary | ICD-10-CM | POA: Diagnosis not present

## 2018-09-25 DIAGNOSIS — R972 Elevated prostate specific antigen [PSA]: Secondary | ICD-10-CM | POA: Diagnosis not present

## 2018-10-13 DIAGNOSIS — J449 Chronic obstructive pulmonary disease, unspecified: Secondary | ICD-10-CM | POA: Diagnosis not present

## 2018-10-13 DIAGNOSIS — E785 Hyperlipidemia, unspecified: Secondary | ICD-10-CM | POA: Diagnosis not present

## 2018-10-13 DIAGNOSIS — I10 Essential (primary) hypertension: Secondary | ICD-10-CM | POA: Diagnosis not present

## 2018-10-13 DIAGNOSIS — E119 Type 2 diabetes mellitus without complications: Secondary | ICD-10-CM | POA: Diagnosis not present

## 2018-10-13 DIAGNOSIS — F17210 Nicotine dependence, cigarettes, uncomplicated: Secondary | ICD-10-CM | POA: Diagnosis not present

## 2018-10-13 DIAGNOSIS — M545 Low back pain: Secondary | ICD-10-CM | POA: Diagnosis not present

## 2018-10-13 DIAGNOSIS — M1990 Unspecified osteoarthritis, unspecified site: Secondary | ICD-10-CM | POA: Diagnosis not present

## 2018-10-13 DIAGNOSIS — H409 Unspecified glaucoma: Secondary | ICD-10-CM | POA: Diagnosis not present

## 2018-10-13 DIAGNOSIS — H9190 Unspecified hearing loss, unspecified ear: Secondary | ICD-10-CM | POA: Diagnosis not present

## 2018-10-21 DIAGNOSIS — I10 Essential (primary) hypertension: Secondary | ICD-10-CM | POA: Diagnosis not present

## 2018-10-21 DIAGNOSIS — E7849 Other hyperlipidemia: Secondary | ICD-10-CM | POA: Diagnosis not present

## 2018-10-21 DIAGNOSIS — E1151 Type 2 diabetes mellitus with diabetic peripheral angiopathy without gangrene: Secondary | ICD-10-CM | POA: Diagnosis not present

## 2018-10-26 ENCOUNTER — Encounter: Payer: Self-pay | Admitting: Neurology

## 2018-10-27 ENCOUNTER — Ambulatory Visit (INDEPENDENT_AMBULATORY_CARE_PROVIDER_SITE_OTHER): Payer: Medicare PPO | Admitting: Neurology

## 2018-10-27 ENCOUNTER — Other Ambulatory Visit: Payer: Self-pay

## 2018-10-27 ENCOUNTER — Encounter: Payer: Self-pay | Admitting: Neurology

## 2018-10-27 VITALS — BP 161/93 | HR 80 | Temp 97.1°F | Ht 71.0 in | Wt 190.0 lb

## 2018-10-27 DIAGNOSIS — J439 Emphysema, unspecified: Secondary | ICD-10-CM

## 2018-10-27 DIAGNOSIS — R5382 Chronic fatigue, unspecified: Secondary | ICD-10-CM

## 2018-10-27 DIAGNOSIS — M542 Cervicalgia: Secondary | ICD-10-CM | POA: Diagnosis not present

## 2018-10-27 DIAGNOSIS — I10 Essential (primary) hypertension: Secondary | ICD-10-CM

## 2018-10-27 DIAGNOSIS — R0683 Snoring: Secondary | ICD-10-CM | POA: Diagnosis not present

## 2018-10-27 NOTE — Progress Notes (Signed)
  SLEEP MEDICINE CLINIC    Provider:  Carmen  Dohmeier, MD  Primary Care Physician:  Paterson, Daniel, MD [address] [place] [address]     Referring Provider: Paterson, Daniel, Md [address] [place],  [place] [address]          Chief Complaint according to patient   Patient presents with:    . New Patient (Initial Visit)           HISTORY OF PRESENT ILLNESS:  Mark Travis is a 67 y.o. year old White or Caucasian male patient of central europe[place] origin. He is seen here upon a referral on 10/27/2018. He was also advised by [employer] PA/NP. Chief concern according to patient :  " I am a smoker, have emphysema, and nocturia . I had lot's of allergies, including cat hair, which affected my sleep".  I have the pleasure of seeing Mark Travis today, a right -handed White or Caucasian male with a possible sleep disorder. He  has  DM type 2. uncntrolled HTN, BPH,  lumbar disc disease. a  has a past medical history of Chronic low back pain, Decreased hearing, Diverticulosis, Emphysema (subcutaneous) (surgical) resulting from a procedure, Fatty liver, Glaucoma, Hiatal hernia, IBS (irritable bowel syndrome), Prostatitis, and Seasonal allergies..     Sleep relevant medical history: Nocturia: lately reduced to 1-2 , Tonsillectomy:none, no  cervical spine surgery ( reported bulging C 4 discs) and never had deviated septum repair.    Family medical /sleep history: No other family member on CPAP with OSA, insomnia, sleep walkers. Mother had CAD, Father's history unknown.    Social history: Patient is semi- retired from accounting, tax advisory, and lives in a household with a total 2 persons.  Family status is married, with adult daughter .  The patient currently works during tax season. Pets are present- one daog, used to have a cat, too. . Tobacco use: 1 ppd, 45 years .   ETOH use socially ,  Caffeine intake in form of Coffee( 2 cups in AM ) Soda( may one a week ) Tea ( unsweetened-  only in restaurants. ) . He does not consume energy drinks. Regular exercise/stretching, walking .   Hobbies : none   Sleep habits are as follows: The patient's dinner time is between 6-7  PM. The patient goes to bed at 11 PM, falls asleep easily and  he  continues to sleep for 1-2 hours, wakes for  bathroom breaks, the first time at 12.30 AM.   The preferred sleep position is right side , with the support of 1 pillow. Bedroom is cool, quiet and dark.  Dreams are reportedly rare.  6.15  AM is the usual rise time. The patient wakes up spontaneously at 6 AM. without alarm.  He reports not feeling refreshed or restored in AM, with symptoms such as dry mouth, stiffness,  morning headaches and with grogginess/ residual fatigue. Naps are taken infrequently, and are not scheduled. Lasting from 60 to 90 minutes and are less refreshing than nocturnal sleep.    Review of Systems: Out of a complete 14 system review, the patient complains of only the following symptoms, and all other reviewed systems are negative.:  Fatigue, sleepiness , snoring, fragmented sleep by nocturia, but had a good 2 weeks on new medications.    How likely are you to doze in the following situations: 0 = not likely, 1 = slight chance, 2 = moderate chance, 3 = high chance   Sitting   and Reading? Watching Television? Sitting inactive in a public place (theater or meeting)? As a passenger in a car for an hour without a break? Lying down in the afternoon when circumstances permit? Sitting and talking to someone? Sitting quietly after lunch without alcohol? In a car, while stopped for a few minutes in traffic?   Total = 10/ 24 points   FSS endorsed at 40/ 63 points. GDS 2/ 15 points - not depressed.    Social History   Socioeconomic History  . Marital status: Married    Spouse name: Not on file  . Number of children: Not on file  . Years of education: Not on file  . Highest education level: Not on file  Occupational  History  . Not on file  Social Needs  . Financial resource strain: Not on file  . Food insecurity    Worry: Not on file    Inability: Not on file  . Transportation needs    Medical: Not on file    Non-medical: Not on file  Tobacco Use  . Smoking status: Former Smoker    Packs/day: 1.00    Years: 34.00    Pack years: 34.00    Types: Cigarettes  . Smokeless tobacco: Never Used  Substance and Sexual Activity  . Alcohol use: Yes  . Drug use: Not Currently  . Sexual activity: Not on file  Lifestyle  . Physical activity    Days per week: Not on file    Minutes per session: Not on file  . Stress: Not on file  Relationships  . Social Herbalist on phone: Not on file    Gets together: Not on file    Attends religious service: Not on file    Active member of club or organization: Not on file    Attends meetings of clubs or organizations: Not on file    Relationship status: Not on file  Other Topics Concern  . Not on file  Social History Narrative  . Not on file    Family History  Problem Relation Age of Onset  . Arthritis Mother   . Heart disease Mother   . Hypertension Mother   . Alcoholism Maternal Grandmother     Past Medical History:  Diagnosis Date  . Chronic low back pain   . Decreased hearing   . Diverticulosis   . Emphysema (subcutaneous) (surgical) resulting from a procedure   . Fatty liver   . Glaucoma   . Hiatal hernia   . IBS (irritable bowel syndrome)   . Prostatitis   . Seasonal allergies     Current Outpatient Medications on File Prior to Visit  Medication Sig Dispense Refill  . amLODipine (NORVASC) 5 MG tablet Take 5 mg by mouth daily.    Marland Kitchen b complex vitamins capsule Take 1 capsule by mouth daily.    . dorzolamide-timolol (COSOPT) 22.3-6.8 MG/ML ophthalmic solution     . ECHINACEA PO Take 1 tablet by mouth daily.    Marland Kitchen ELDERBERRY PO Take 1 tablet by mouth daily.    . finasteride (PROSCAR) 5 MG tablet     . latanoprost (XALATAN)  0.005 % ophthalmic solution     . metFORMIN (GLUCOPHAGE-XR) 500 MG 24 hr tablet 1 tablet 2 (two) times a day.    . Omega-3 Fatty Acids (OMEGA-3 FISH OIL PO) Take by mouth.    . Saw Palmetto, Serenoa repens, (SAW PALMETTO PO) Take 1 tablet by mouth daily.    Marland Kitchen  timolol (BETIMOL) 0.25 % ophthalmic solution 1-2 drops 2 (two) times daily.    Marland Kitchen VITAMIN A PO Take 1 tablet by mouth daily.    Marland Kitchen VITAMIN D PO Take 1 tablet by mouth daily.    Marland Kitchen VITAMIN E PO Take 1 tablet by mouth daily.    Marland Kitchen Zoster Vaccine Adjuvanted (SHINGRIX) injection Shingrix (PF) 50 mcg/0.5 mL intramuscular suspension, kit  TO BE ADMINISTERED BY PHARMACIST FOR IMMUNIZATION     No current facility-administered medications on file prior to visit.     Not on File  Physical exam:  Today's Vitals   10/27/18 0851  BP: (!) 161/93  Pulse: 80  Temp: (!) 97.1 F (36.2 C)  Weight: 190 lb (86.2 kg)  Height: 5' 11" (1.803 m)   Body mass index is 26.5 kg/m.   Wt Readings from Last 3 Encounters:  10/27/18 190 lb (86.2 kg)     Ht Readings from Last 3 Encounters:  10/27/18 5' 11" (1.803 m)      General: The patient is awake, alert and appears not in acute distress. The patient is well groomed. Head: Normocephalic, atraumatic. Neck is supple. Mallampati 3,  neck circumference: 15  inches . Nasal airflow congestion reported- yet now and here being patent.  Retrognathia is seen.  Dental status: all biological  Cardiovascular:  Regular rate and cardiac rhythm by pulse,  without distended neck veins. Respiratory: Lungs are clear to auscultation.  Skin:  Without evidence of ankle edema, or rash. Trunk: The patient's posture is erect.   Neurologic exam : The patient is awake and alert, oriented to place and time.   Memory subjective described as intact.  Attention span & concentration ability appears normal.  Speech is fluent, without dysarthria, dysphonia or aphasia.  Mood and affect are appropriate.   Cranial nerves: no  loss of smell or taste reported  Pupils are equal and briskly reactive to light. Funduscopic exam deferred.  Extraocular movements in vertical and horizontal planes were intact and without nystagmus. No Diplopia. Visual fields by finger perimetry are intact. Hearing was intact to soft voice and finger rubbing.   Facial sensation intact to fine touch. He has reported TMJ  Click /pain.  Facial motor strength is symmetric and tongue and uvula move midline.  Neck ROM : rotation, tilt and flexion extension were tested and except for left tilt all normal for age. The shoulder shrug was symmetrical.    Motor exam:  Symmetric bulk, tone and ROM.   Normal tone without cog-wheeling, symmetric grip strength . He has developed difficulties opening a jar.    Sensory:  Fine touch, pinprick and vibration were tested  and  normal.  Proprioception tested in the upper extremities was normal.   Coordination: Rapid alternating movements in the fingers/hands were of normal speed. He reports changes in handwriting, attributed to arthritis. The Finger-to-nose maneuver was intact without evidence of ataxia, dysmetria or tremor.   Gait and station: Patient could rise unassisted from a seated position, walked without assistive device.  Stance is of normal width/ base and the patient turned with 3 steps.  Toe and heel walk were deferred.  Deep tendon reflexes: in the  upper and lower extremities are symmetric and intact. Babinski response was deferred.        After spending a total time of  35  minutes face to face and additional time for physical and neurologic examination, review of laboratory studies,  personal review of imaging studies, reports and results  of other testing and review of referral information / records as far as provided in visit, I have established the following assessments:  1) sleep deprivation -overall reduced total sleep time at 6 hours , which had been reduced by nocturia up to 4 times until  recently, neck pain leads to sleep interruptions , too   2) nocturnal nasal congestion alongside snoring and witnessed apnea.   3) not so much excessive daytime sleepiness as fatigue being preset hypoxemia risk is high due to smoking, please connect with fatigue and headaches.    My Plan is to proceed with:  1) HST for apnea screening.  2) I will order attended SPLIT night I case his medicare plan provides this as only option.   I would like to thank Dr Paterson, MD  for allowing me to meet with and to take care of this pleasant patient.   In short, Mark Travis is presenting with many symptoms that can be attributed to OSA, and sleep hypoxia.   I plan to follow up either personally or through our NP within 2-3 month.   CC: I will share my notes with Dr. D. Brooks, MD   Electronically signed by: Carmen Dohmeier, MD 10/27/2018 9:13 AM  [facility] and [facility] certified by The American Board of Sleep Medicine and Diplomate of the American Academy of Sleep Medicine. Board certified In Neurology through the ABPN, Fellow of the American Academy of Neurology. Medical Director of [facility].    

## 2018-11-05 DIAGNOSIS — H401132 Primary open-angle glaucoma, bilateral, moderate stage: Secondary | ICD-10-CM | POA: Diagnosis not present

## 2018-11-23 ENCOUNTER — Ambulatory Visit (INDEPENDENT_AMBULATORY_CARE_PROVIDER_SITE_OTHER): Payer: Medicare PPO | Admitting: Neurology

## 2018-11-23 DIAGNOSIS — J449 Chronic obstructive pulmonary disease, unspecified: Secondary | ICD-10-CM

## 2018-11-23 DIAGNOSIS — G4733 Obstructive sleep apnea (adult) (pediatric): Secondary | ICD-10-CM | POA: Diagnosis not present

## 2018-11-23 DIAGNOSIS — J439 Emphysema, unspecified: Secondary | ICD-10-CM

## 2018-11-23 DIAGNOSIS — I10 Essential (primary) hypertension: Secondary | ICD-10-CM

## 2018-11-23 DIAGNOSIS — M542 Cervicalgia: Secondary | ICD-10-CM

## 2018-11-23 DIAGNOSIS — R5382 Chronic fatigue, unspecified: Secondary | ICD-10-CM

## 2018-11-23 DIAGNOSIS — R0683 Snoring: Secondary | ICD-10-CM

## 2018-12-08 ENCOUNTER — Telehealth: Payer: Self-pay | Admitting: Neurology

## 2018-12-08 NOTE — Telephone Encounter (Signed)
Pt called wanting the results for his home sleep study. Please advise.

## 2018-12-09 ENCOUNTER — Telehealth: Payer: Self-pay | Admitting: Neurology

## 2018-12-09 DIAGNOSIS — R0683 Snoring: Secondary | ICD-10-CM

## 2018-12-09 DIAGNOSIS — G4733 Obstructive sleep apnea (adult) (pediatric): Secondary | ICD-10-CM | POA: Insufficient documentation

## 2018-12-09 DIAGNOSIS — J439 Emphysema, unspecified: Secondary | ICD-10-CM | POA: Insufficient documentation

## 2018-12-09 DIAGNOSIS — I10 Essential (primary) hypertension: Secondary | ICD-10-CM

## 2018-12-09 DIAGNOSIS — R5382 Chronic fatigue, unspecified: Secondary | ICD-10-CM

## 2018-12-09 NOTE — Addendum Note (Signed)
Addended by: Larey Seat on: 12/09/2018 08:49 AM   Modules accepted: Orders

## 2018-12-09 NOTE — Telephone Encounter (Signed)
Called the pt and reviewed his sleep study results with him. Informed him of the home sleep test findings and reviewed the recommendations that were made by Dr Dohmeier. Reviewed in detailed what auto CPAP was and that was her recommendation. Advised that she would also be willing to complete a referral to dentist to have a dental device created. Advised that depends on insurance on whether they will cover it and he states he would like to check the dental device route first. The pt will call us back if insurance declines coverage and move forward with CPAP route if needed.

## 2018-12-09 NOTE — Telephone Encounter (Signed)
-----   Message from Larey Seat, MD sent at 12/09/2018  8:49 AM EDT ----- Summary & Diagnosis:   Sleep Apnea was noted at moderate severity with an AHI of 26.7/h, 10 % of events were central apneas. There was no hypoxia recorded. Neither loud snoring nor REM accentuation were noted. There was a strong association with the supine sleep position.  Recommendations:    I recommend to use CPAP or dental device for the treatment of OSA.  If the patient is open to use CPAP therapy, I will order an autotitration CPAP with a pressure setting of 6-15 cm water and 2 cm EPR, heated humidity and mask of his choice.

## 2018-12-09 NOTE — Procedures (Signed)
Patient Information     First Name: Mark Last Name: Travis ID: [id number]  Birth Date: 02-02-1951 Age: 68 Gender: Male  Referring Provider: Leanna Battles, MD BMI: 26.5 (W=189 lb, H=5' 11'')  Neck Circ.:  15" Epworth:  10/24   Sleep Study Information    Study Date: Nov 23, 2018 S/H/A Version: 001.001.001.001 / 4.1.1528 / 77  History:    Mark Travis is a 68 y.o. year old Caucasian male patient of central European origin, seen on 10-27-2018 upon referral by Dr. Philip Aspen. He was also advised by [facility] PA/NP to have a sleep study. Chief concern according to patient:  "I am a smoker, have emphysema, and nocturia. I had lots of allergies, including to cat hair, which affected my sleep". He has DM type 2. uncontrolled HTN, BPH, medical history of Chronic low back pain, Decreased Hearing, Diverticulosis, Emphysema (subcutaneous) (surgical) resulting from a procedure, Fatty liver, Glaucoma, Hiatal hernia, IBS (irritable bowel syndrome), Prostatitis, and Seasonal allergies.         Summary & Diagnosis:    Sleep Apnea was noted at moderate severity with an AHI of 26.7/h, 10 % of events were central apneas. There was no hypoxia recorded. Neither loud snoring nor REM accentuation were noted. There was a strong association with the supine sleep position.  Recommendations:     I recommend to use CPAP or dental device for the treatment of OSA.  If the patient is open to use CPAP therapy, I will order an autotitration CPAP with a pressure setting of 6-15 cm water and 2 cm EPR, heated humidity and mask of his choice.   Electronically Signed: Larey Seat, MD   12-08-2018             Sleep Summary  Oxygen Saturation Statistics   Start Study Time: End Study Time: Total Recording Time:  10:13:57 PM   6:46:11 AM   8 h, 32 min  Total Sleep Time % REM of Sleep Time:  7 h, 50 min  26.5    Mean: 95 Minimum: 90 Maximum: 99  Mean of Desaturations Nadirs (%):   93  Oxygen Desaturation. %:  4-9  10-20 >20 Total  Events Number Total   49 98.0   1  0  2.0 0.0  50 100.0  Oxygen Saturation: <90 <=88  <85 <80 <70  Duration (minutes): Sleep % 0.0 0.0 0.0 0.0  0.0 0.0  0.0 0.0 0.0 0.0     Respiratory Indices      Total Events REM NREM All Night  pRDI:  80  pAHI:  79 ODI:  50  pAHIc:  10  % CSR: 0.0 27.5 27.5 9.7 6.5 26.9 26.5 18.8 2.6 27.1 26.7 16.9 3.4       Pulse Rate Statistics during Sleep (BPM)      Mean:  70 Minimum: 54 Maximum: 90    Indices are calculated using technically valid sleep time of  2 hrs, 57 min. pRDI/pAHI are calculated using oxi desaturations ? 3%  Body Position Statistics  Position Supine Prone Right Left Non-Supine  Sleep (min) 84.0 133.0 251.2 0.0 384.2  Sleep % 17.9 28.3 53.4 0.0 81.7  pRDI 72.5 47.1 12.1 N/A 19.9  pAHI 70.0 47.1 12.1 N/A 19.9  ODI 62.5 24.4 5.5 N/A 9.8     Snoring Statistics Snoring Level (dB) >40 >50 >60 >70 >80 >Threshold (45)  Sleep (min) 146.4 15.5 0.8 0.0 0.0 62.0  Sleep % 31.1 3.3 0.2 0.0 0.0 13.2  Mean: 42 dB Sleep Stages Chart

## 2018-12-09 NOTE — Telephone Encounter (Signed)
Results are in EPIC.

## 2018-12-14 DIAGNOSIS — N401 Enlarged prostate with lower urinary tract symptoms: Secondary | ICD-10-CM | POA: Diagnosis not present

## 2018-12-14 DIAGNOSIS — R972 Elevated prostate specific antigen [PSA]: Secondary | ICD-10-CM | POA: Diagnosis not present

## 2018-12-14 DIAGNOSIS — N138 Other obstructive and reflux uropathy: Secondary | ICD-10-CM | POA: Diagnosis not present

## 2018-12-16 NOTE — Telephone Encounter (Signed)
Pt is asking for a call  Back to discuss CPAP pricing

## 2018-12-16 NOTE — Telephone Encounter (Addendum)
I called pt back about cipap prices. I stated the cipap order was put in on 12/09/2018 and it went  to [place] for the  insurance company to authorized  and determine  payment. I stated our office does not know the price of cipap machines. The pt stated he does want the dental device because of the price. I stated it can take 7 to 14 days for insurance to authorized and determine the price he will pay for the cipap. Pt verbalized understanding.

## 2018-12-16 NOTE — Telephone Encounter (Signed)
revised 

## 2018-12-18 DIAGNOSIS — I10 Essential (primary) hypertension: Secondary | ICD-10-CM | POA: Diagnosis not present

## 2018-12-18 DIAGNOSIS — I2510 Atherosclerotic heart disease of native coronary artery without angina pectoris: Secondary | ICD-10-CM | POA: Diagnosis not present

## 2018-12-18 DIAGNOSIS — E1151 Type 2 diabetes mellitus with diabetic peripheral angiopathy without gangrene: Secondary | ICD-10-CM | POA: Diagnosis not present

## 2018-12-18 DIAGNOSIS — I739 Peripheral vascular disease, unspecified: Secondary | ICD-10-CM | POA: Diagnosis not present

## 2018-12-28 NOTE — Telephone Encounter (Signed)
Pt called stating that [name] informed him that no request was put in for his Cpap machine and he would like to know the update on this. Please advise.

## 2018-12-28 NOTE — Telephone Encounter (Signed)
Called the patient back to advise that I will send the order [employer] for him. Gave the number to [employer] for him to call if he has not heard anything in a couple of days. Reviewed compliance requirements for medicare and scheduled for a follow up 03/04/2019 at 11 am with check in 10:30 am with Ward Givens, NP

## 2019-01-05 DIAGNOSIS — J90 Pleural effusion, not elsewhere classified: Secondary | ICD-10-CM | POA: Diagnosis not present

## 2019-01-05 DIAGNOSIS — R6883 Chills (without fever): Secondary | ICD-10-CM | POA: Diagnosis not present

## 2019-01-05 DIAGNOSIS — R0982 Postnasal drip: Secondary | ICD-10-CM | POA: Diagnosis not present

## 2019-01-05 DIAGNOSIS — R52 Pain, unspecified: Secondary | ICD-10-CM | POA: Diagnosis not present

## 2019-01-05 DIAGNOSIS — R05 Cough: Secondary | ICD-10-CM | POA: Diagnosis not present

## 2019-01-05 DIAGNOSIS — Z20828 Contact with and (suspected) exposure to other viral communicable diseases: Secondary | ICD-10-CM | POA: Diagnosis not present

## 2019-01-13 DIAGNOSIS — G4733 Obstructive sleep apnea (adult) (pediatric): Secondary | ICD-10-CM | POA: Diagnosis not present

## 2019-01-15 DIAGNOSIS — J90 Pleural effusion, not elsewhere classified: Secondary | ICD-10-CM | POA: Diagnosis not present

## 2019-01-15 DIAGNOSIS — R351 Nocturia: Secondary | ICD-10-CM | POA: Diagnosis not present

## 2019-01-15 DIAGNOSIS — E1151 Type 2 diabetes mellitus with diabetic peripheral angiopathy without gangrene: Secondary | ICD-10-CM | POA: Diagnosis not present

## 2019-02-11 DIAGNOSIS — M47812 Spondylosis without myelopathy or radiculopathy, cervical region: Secondary | ICD-10-CM | POA: Diagnosis not present

## 2019-02-11 DIAGNOSIS — M9903 Segmental and somatic dysfunction of lumbar region: Secondary | ICD-10-CM | POA: Diagnosis not present

## 2019-02-11 DIAGNOSIS — S29012A Strain of muscle and tendon of back wall of thorax, initial encounter: Secondary | ICD-10-CM | POA: Diagnosis not present

## 2019-02-11 DIAGNOSIS — S39012A Strain of muscle, fascia and tendon of lower back, initial encounter: Secondary | ICD-10-CM | POA: Diagnosis not present

## 2019-02-11 DIAGNOSIS — M9901 Segmental and somatic dysfunction of cervical region: Secondary | ICD-10-CM | POA: Diagnosis not present

## 2019-02-11 DIAGNOSIS — M9902 Segmental and somatic dysfunction of thoracic region: Secondary | ICD-10-CM | POA: Diagnosis not present

## 2019-02-12 DIAGNOSIS — G4733 Obstructive sleep apnea (adult) (pediatric): Secondary | ICD-10-CM | POA: Diagnosis not present

## 2019-03-04 ENCOUNTER — Ambulatory Visit (INDEPENDENT_AMBULATORY_CARE_PROVIDER_SITE_OTHER): Payer: Medicare PPO | Admitting: Adult Health

## 2019-03-04 ENCOUNTER — Encounter: Payer: Self-pay | Admitting: Adult Health

## 2019-03-04 ENCOUNTER — Other Ambulatory Visit: Payer: Self-pay

## 2019-03-04 VITALS — BP 134/82 | HR 72 | Temp 97.5°F | Ht 71.0 in | Wt 190.6 lb

## 2019-03-04 DIAGNOSIS — Z9989 Dependence on other enabling machines and devices: Secondary | ICD-10-CM

## 2019-03-04 DIAGNOSIS — G4733 Obstructive sleep apnea (adult) (pediatric): Secondary | ICD-10-CM

## 2019-03-04 NOTE — Patient Instructions (Signed)
Your Plan:  Consider dental device Discuss referral to ENT with your PCP If your symptoms worsen or you develop new symptoms please let us know.    Thank you for coming to see [place] at [facility]. I hope we have been able to provide you high quality care today.  You may receive a patient satisfaction survey over the next few weeks. We would appreciate your feedback and comments so that we may continue to improve ourselves and the health of our patients.

## 2019-03-04 NOTE — Progress Notes (Signed)
PATIENT: Mark Travis DOB: [birth year]  REASON FOR VISIT: follow up HISTORY FROM: patient  HISTORY OF PRESENT ILLNESS: Today 03/04/19:  Mark Travis is a 68 year old male with a history of obstructive sleep apnea on CPAP.  He returns today for follow-up.  The patient states that he tried the CPAP but could not tolerate it.  He states that after an hour of using it he would wake up gasping for air.  He states that he did work with the [place] but none of the changes made it more comfortable for him to use the machine.  He did not call our office regarding his difficulty with using the CPAP.  We discussed using a dental device today however he states that he cannot afford this option at this time.  He reports that he has a partially deviated septum.  He returns today for an evaluation.  HISTORY (Copied from Dr.Dohmeier's note) Mark Travis a 68 y.o. year old White or Caucasian male patientof central european origin. He is seen here upon a referralon 10/27/2018.He was also advised by [facility] PA/NP. Chiefconcernaccording to patient : " I am a smoker, have emphysema, and nocturia . I had lot's of allergies, including cat hair, which affected my sleep".  I have the pleasure of seeing Mark Travis today,a right -handed White or Caucasian male with a possible sleep disorder. He  has  DM type 2. uncntrolled HTN, BPH,  lumbar disc disease. a  has a past medical history of Chronic low back pain, Decreased hearing, Diverticulosis, Emphysema (subcutaneous) (surgical) resulting from a procedure, Fatty liver, Glaucoma, Hiatal hernia, IBS (irritable bowel syndrome), Prostatitis, and Seasonal allergies.Mark Travis medical history: Nocturia: lately reduced to 1-2 , Tonsillectomy:none, no  cervical spine surgery ( reported bulging C 4 discs) and never had deviated septum repair.   Familymedical /sleep history: No other family member on CPAP with OSA, insomnia, sleep walkers.Mother had CAD,  Father's history unknown.   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  Family status is married, with adult daughter .  The patient currently works during tax season. Pets are present- one daog, used to have a cat, too. . Tobacco use: 1 ppd, 45 years .  ETOH use socially ,  Caffeine intake in form of Coffee( 2 cups in AM ) Soda( may one a week ) Tea ( unsweetened- only in restaurants. ) . He does not consume energy drinks. Regular exercise/stretching, walking .   Hobbies : none   Sleep habits are as follows:The patient's dinner time is between 6-7  PM. The patient goes to bed at 11 PM, falls asleep easily and  he  continues to sleep for 1-2 hours, wakes for  bathroom breaks, the first time at 12.30 AM.   The preferred sleep position is right side , with the support of 1 pillow. Bedroom is cool, quiet and dark.  Dreams are reportedly rare.  6.15  AM is the usual rise time. The patient wakes up spontaneously at 6 AM. without alarm.  He reports not feeling refreshed or restored in AM, with symptoms such as dry mouth, stiffness,  morning headaches and with grogginess/ residual fatigue. Naps are taken infrequently, and are not scheduled. Lasting from 60 to 90 minutes and are less refreshing than nocturnal sleep.   REVIEW OF SYSTEMS: Out of a complete 14 system review of symptoms, the patient complains only of the following symptoms, and  all other reviewed systems are negative.  Epworth sleepiness score 10 fatigue severity score 40  ALLERGIES: No Known Allergies  HOME MEDICATIONS: Outpatient Medications Prior to Visit  Medication Sig Dispense Refill  . amLODipine (NORVASC) 5 MG tablet Take 5 mg by mouth daily.    Marland Kitchen b complex vitamins capsule Take 1 capsule by mouth daily.    . dorzolamide-timolol (COSOPT) 22.3-6.8 MG/ML ophthalmic solution     . ECHINACEA PO Take 1 tablet by mouth daily.    Marland Kitchen ELDERBERRY PO  Take 1 tablet by mouth daily.    . finasteride (PROSCAR) 5 MG tablet     . latanoprost (XALATAN) 0.005 % ophthalmic solution     . metFORMIN (GLUCOPHAGE-XR) 500 MG 24 hr tablet 1 tablet 2 (two) times a day.    . Omega-3 Fatty Acids (OMEGA-3 FISH OIL PO) Take by mouth.    Marland Kitchen REPATHA SURECLICK 196 MG/ML SOAJ 222 mg. Every 2 weeks    . Saw Palmetto, Serenoa repens, (SAW PALMETTO PO) Take 1 tablet by mouth daily.    . timolol (BETIMOL) 0.25 % ophthalmic solution 1-2 drops 2 (two) times daily.    Marland Kitchen VITAMIN A PO Take 1 tablet by mouth daily.    Marland Kitchen VITAMIN D PO Take 1 tablet by mouth daily.    Marland Kitchen VITAMIN E PO Take 1 tablet by mouth daily.    Marland Kitchen Zoster Vaccine Adjuvanted (SHINGRIX) injection Shingrix (PF) 50 mcg/0.5 mL intramuscular suspension, kit  TO BE ADMINISTERED BY PHARMACIST FOR IMMUNIZATION     No facility-administered medications prior to visit.     PAST MEDICAL HISTORY: Past Medical History:  Diagnosis Date  . Chronic low back pain   . Decreased hearing   . Diverticulosis   . Emphysema (subcutaneous) (surgical) resulting from a procedure   . Fatty liver   . Glaucoma   . Hiatal hernia   . IBS (irritable bowel syndrome)   . OSA on CPAP   . Prostatitis   . Seasonal allergies     PAST SURGICAL HISTORY: Past Surgical History:  Procedure Laterality Date  . COLONOSCOPY  10/2012  . L trigger finger release      FAMILY HISTORY: Family History  Problem Relation Age of Onset  . Arthritis Mother   . Heart disease Mother   . Hypertension Mother   . Alcoholism Maternal Grandmother     SOCIAL HISTORY: Social History   Socioeconomic History  . Marital status: Married    Spouse name: Not on file  . Number of children: Not on file  . Years of education: Not on file  . Highest education level: Not on file  Occupational History  . Not on file  Social Needs  . Financial resource strain: Not on file  . Food insecurity    Worry: Not on file    Inability: Not on file  .  Transportation needs    Medical: Not on file    Non-medical: Not on file  Tobacco Use  . Smoking status: Former Smoker    Packs/day: 1.00    Years: 34.00    Pack years: 34.00    Types: Cigarettes  . Smokeless tobacco: Never Used  Substance and Sexual Activity  . Alcohol use: Yes  . Drug use: Not Currently  . Sexual activity: Not on file  Lifestyle  . Physical activity    Days per week: Not on file    Minutes per session: Not on file  . Stress: Not on file  Relationships  . Social Herbalist on phone: Not on file    Gets together: Not on file    Attends religious service: Not on file    Active member of club or organization: Not on file    Attends meetings of clubs or organizations: Not on file    Relationship status: Not on file  . Intimate partner violence    Fear of current or ex partner: Not on file    Emotionally abused: Not on file    Physically abused: Not on file    Forced sexual activity: Not on file  Other Topics Concern  . Not on file  Social History Narrative  . Not on file      PHYSICAL EXAM  Vitals:   03/04/19 1046  BP: 134/82  Pulse: 72  Temp: (!) 97.5 F (36.4 C)  Weight: 190 lb 9.6 oz (86.5 kg)  Height: _0  (1.803 m)   Body mass index is 26.58 kg/m.  Generalized: Well developed, in no acute distress  Chest: Lungs clear to auscultation bilaterally  Neurological examination  Mentation: Alert oriented to time, place, history taking. Follows all commands speech and language fluent Cranial nerve II-XII: Extraocular movements were full, visual field were full on confrontational test Head turning and shoulder shrug  were normal and symmetric. Motor: The motor testing reveals 5 over 5 strength of all 4 extremities. Good symmetric motor tone is noted throughout.  Sensory: Sensory testing is intact to soft touch on all 4 extremities. No evidence of extinction is noted.  Gait and station: Gait is normal.    DIAGNOSTIC DATA (LABS,  IMAGING, TESTING) - I reviewed patient records, labs, notes, testing and imaging myself where available.     ASSESSMENT AND PLAN 68 y.o. year old male  has a past medical history of Chronic low back pain, Decreased hearing, Diverticulosis, Emphysema (subcutaneous) (surgical) resulting from a procedure, Fatty liver, Glaucoma, Hiatal hernia, IBS (irritable bowel syndrome), OSA on CPAP, Prostatitis, and Seasonal allergies. here with :  1.  Obstructive sleep apnea  The patient cannot tolerate CPAP.  He has no interest in retrying this.  We discussed a dental device however he states that they are too expensive.  He states that he may try an over-the-counter dental device.  I did advise that I do not know how effective this would be in treating his sleep apnea.  We did discuss risk of untreated sleep apnea.  The patient reports that he has a partially deviated septum.  I did advise that he may need a referral to ENT.  Advised to discuss this with his PCP.  He will follow-up with our office on an as-needed basis.  I spent 15 minutes with the patient. 50% of this time was spent discussing the risk associated with untreated sleep apnea and treatment options   Mark Givens, MSN, NP-C 03/04/2019, 11:01 AM [facility] [address], [place], [place] [address] [phone]

## 2019-03-19 DIAGNOSIS — K588 Other irritable bowel syndrome: Secondary | ICD-10-CM | POA: Insufficient documentation

## 2019-03-19 DIAGNOSIS — H4089 Other specified glaucoma: Secondary | ICD-10-CM | POA: Insufficient documentation

## 2019-03-19 DIAGNOSIS — J302 Other seasonal allergic rhinitis: Secondary | ICD-10-CM | POA: Insufficient documentation

## 2019-03-19 DIAGNOSIS — K760 Fatty (change of) liver, not elsewhere classified: Secondary | ICD-10-CM | POA: Insufficient documentation

## 2019-03-19 DIAGNOSIS — G8929 Other chronic pain: Secondary | ICD-10-CM | POA: Insufficient documentation

## 2019-03-23 DIAGNOSIS — E119 Type 2 diabetes mellitus without complications: Secondary | ICD-10-CM | POA: Insufficient documentation

## 2019-03-31 DIAGNOSIS — J342 Deviated nasal septum: Secondary | ICD-10-CM | POA: Insufficient documentation

## 2019-05-27 DIAGNOSIS — H02401 Unspecified ptosis of right eyelid: Secondary | ICD-10-CM | POA: Insufficient documentation

## 2019-11-03 ENCOUNTER — Other Ambulatory Visit: Payer: Self-pay | Admitting: Physician Assistant

## 2019-11-03 DIAGNOSIS — Z122 Encounter for screening for malignant neoplasm of respiratory organs: Secondary | ICD-10-CM

## 2019-11-03 DIAGNOSIS — R911 Solitary pulmonary nodule: Secondary | ICD-10-CM

## 2019-11-09 DIAGNOSIS — E785 Hyperlipidemia, unspecified: Secondary | ICD-10-CM | POA: Insufficient documentation

## 2019-11-12 ENCOUNTER — Other Ambulatory Visit: Payer: Self-pay

## 2019-11-12 ENCOUNTER — Ambulatory Visit
Admission: RE | Admit: 2019-11-12 | Discharge: 2019-11-12 | Disposition: A | Discharge status: Home / Self Care | Payer: Medicare PPO | Source: Ambulatory Visit | Attending: Physician Assistant | Admitting: Physician Assistant

## 2019-11-12 DIAGNOSIS — R911 Solitary pulmonary nodule: Secondary | ICD-10-CM

## 2019-11-12 DIAGNOSIS — Z122 Encounter for screening for malignant neoplasm of respiratory organs: Secondary | ICD-10-CM

## 2019-11-25 ENCOUNTER — Other Ambulatory Visit: Payer: Self-pay | Admitting: Physician Assistant

## 2019-11-25 DIAGNOSIS — IMO0001 Reserved for inherently not codable concepts without codable children: Secondary | ICD-10-CM | POA: Insufficient documentation

## 2019-11-25 DIAGNOSIS — R911 Solitary pulmonary nodule: Secondary | ICD-10-CM

## 2019-12-24 DIAGNOSIS — M5412 Radiculopathy, cervical region: Secondary | ICD-10-CM | POA: Insufficient documentation

## 2020-01-19 ENCOUNTER — Ambulatory Visit: Payer: Medicare PPO | Admitting: Physical Therapy

## 2020-01-26 ENCOUNTER — Ambulatory Visit: Payer: Medicare PPO | Admitting: Physical Therapy

## 2020-02-02 ENCOUNTER — Encounter: Payer: Medicare PPO | Admitting: Physical Therapy

## 2020-02-09 ENCOUNTER — Ambulatory Visit: Payer: Medicare PPO | Admitting: Physical Therapy

## 2020-02-24 ENCOUNTER — Other Ambulatory Visit: Payer: Self-pay

## 2020-02-24 ENCOUNTER — Ambulatory Visit: Payer: Medicare PPO | Attending: Orthopedic Surgery

## 2020-02-24 DIAGNOSIS — R252 Cramp and spasm: Secondary | ICD-10-CM

## 2020-02-24 DIAGNOSIS — M542 Cervicalgia: Secondary | ICD-10-CM | POA: Diagnosis not present

## 2020-02-24 DIAGNOSIS — M6281 Muscle weakness (generalized): Secondary | ICD-10-CM | POA: Insufficient documentation

## 2020-02-24 DIAGNOSIS — M79602 Pain in left arm: Secondary | ICD-10-CM | POA: Insufficient documentation

## 2020-02-24 NOTE — Therapy (Signed)
[facility] [address], [place], [place], [address] Phone: [phone]   Fax:  [phone]  Physical Therapy Evaluation  Patient Details  Name: Mark Travis MRN: [id number] Date of Birth: [birth year] Referring Provider (PT): Melina Schools, MD   Encounter Date: 02/24/2020   PT End of Session - 02/24/20 1146    Visit Number 1    Date for PT Re-Evaluation 05/18/20    Authorization Type [place]: visits requested    PT Start Time 1103    PT Stop Time 1148    PT Time Calculation (min) 45 min    Activity Tolerance Patient tolerated treatment well    Behavior During Therapy [facility] for tasks assessed/performed           Past Medical History:  Diagnosis Date   Chronic low back pain    Decreased hearing    Diverticulosis    Emphysema (subcutaneous) (surgical) resulting from a procedure    Fatty liver    Glaucoma    Hiatal hernia    IBS (irritable bowel syndrome)    OSA on CPAP    Prostatitis    Seasonal allergies     Past Surgical History:  Procedure Laterality Date   COLONOSCOPY  10/2012   L trigger finger release      There were no vitals filed for this visit.    Subjective Assessment - 02/24/20 1104    Subjective Pt presents to PT with neck pain and Lt UE radiculopathy of a chronic nature.  Pain flarred up in September.  [facility] not been [facility] this since pain began.  Pt had recent MRI and reports spurring in the cervical spine.  Pt had steriod injection last week without any change in symptoms.    Pertinent History LBP    Limitations Sitting;Standing;Walking    How long can you sit comfortably? 1 hour max-neck pain    Diagnostic tests MRI: C4,5 and 7- DDD with spurring per pt report.    Patient Stated Goals reduce Lt UE pain, reduce neck pain    Currently in Pain? Yes    Pain Score 4     Pain Location Neck    Pain Orientation Right;Left    Pain  Descriptors / Indicators Sore    Pain Radiating Towards Lt UE-intermittent    Pain Onset More than a month ago    Pain Frequency Intermittent    Aggravating Factors  sitting long periods for work, sleep    Pain Relieving Factors heat, Advil              [facility] PT Assessment - 02/24/20 0001      Assessment   Medical Diagnosis cervicalgia    Referring Provider (PT) Melina Schools, MD    Onset Date/Surgical Date 12/25/19   chronic with flare-up   Next MD Visit 2 months       Precautions   Precautions None      Restrictions   Weight Bearing Restrictions No      Balance Screen   Has the patient fallen in the past 6 months No    Has the patient had a decrease in activity level because of a fear of falling?  No    Is the patient reluctant to leave their home because of a fear of falling?  No      Home Ecologist residence    Living Arrangements Spouse/significant other  Home Access Stairs to enter    Home Layout Two level      Prior Function   Level of Independence Independent    Vocation Part time employment    Vocation Requirements Desk-accountant      Cognition   Overall Cognitive Status Within Functional Limits for tasks assessed      Observation/Other Assessments   Focus on Therapeutic Outcomes (FOTO)  49% limitation      Posture/Postural Control   Posture/Postural Control Postural limitations    Postural Limitations Forward head;Flexed trunk      ROM / Strength   AROM / PROM / Strength AROM;Strength;PROM      AROM   Overall AROM  Deficits    Overall AROM Comments cervical flexion is full, extension limited by 25%, Rt sidebending is full, Lt sidebending limited by 25% with Lt neck pain.      AROM Assessment Site Cervical    Cervical - Right Rotation 55    Cervical - Left Rotation 50      PROM   Overall PROM  Deficits    Overall PROM Comments cervical sidebending limited by 25% bilaterally, rotation is full      Strength    Overall Strength Deficits    Overall Strength Comments Lt UE 4/5, Rt UE 4+/5 to 5/5      Palpation   Palpation comment tension in bil suboccipitals and upper traps      Special Tests    Special Tests Cervical    Cervical Tests Spurling's;Dictraction      Spurling's   Findings Negative      Distraction Test   Findngs Negative      Ambulation/Gait   Ambulation/Gait Yes    Gait Pattern Within Functional Limits                      Objective measurements completed on examination: See above findings.               PT Education - 02/24/20 1142    Education Details Access Code: [email or web address], use of home traction unit that pt brought with him    Person(s) Educated Patient    Methods Explanation;Demonstration;Handout    Comprehension Verbalized understanding;Returned demonstration            PT Short Term Goals - 02/24/20 1115      PT SHORT TERM GOAL #1   Title be independent in initial HEP    Time 6    Period Weeks    Status New    Target Date 04/06/20      PT SHORT TERM GOAL #2   Title report 30% reduction in the frequency and intensity of neck pain and Lt UE pain    Time 6    Period Weeks    Status New    Target Date 04/06/20      PT SHORT TERM GOAL #3   Title demonstrate 4+/5 Lt shoulder strength throughout to improve endurance with use    Time 6    Period Weeks    Status New    Target Date 04/06/20             PT Long Term Goals - 02/24/20 1247      PT LONG TERM GOAL #1   Title be independent in advanced HEP    Time 12    Period Weeks    Status New    Target Date 05/18/20      PT  LONG TERM GOAL #2   Title reduce FOTO to < or = to 37% limitation    Time 12    Period Weeks    Status New    Target Date 05/18/20      PT LONG TERM GOAL #3   Title report a 70% reduction in the frequency and intensity of neck pain and Lt UE radiculopathy    Time 12    Period Weeks    Status New    Target Date 05/18/20      PT LONG TERM GOAL  #4   Title tolerate sitting for 1.5 hours with mini break without increased neck or Lt UE pain    Time 12    Period Weeks    Status New    Target Date 05/18/20      PT LONG TERM GOAL #5   Title demonstarte 5/5 Lt shoulder strength to improve endurance with use    Time 12    Period Weeks    Target Date 05/18/20      Additional Long Term Goals   Additional Long Term Goals Yes      PT LONG TERM GOAL #6   Title return to regular exercise routine at the [facility] without limitation or increased pain    Time 12    Period Weeks    Status New    Target Date 05/18/20                  Plan - 02/24/20 1253    Clinical Impression Statement Pt presents to PT with neck pain and Lt UE pain that is chronic and flared up in September 2021.  Pt was exercising regularly at the [facility] prior to waking up with pian and has not return to exercise since that day.  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  Pt has a home cervical Saunders traction unit and would like to learn how to use it.  Pt reports bil cervical pain that is fairly constant and intermittent Lt UE pain that extends to the hand.  Pt with limited cervical a/ROM into Lt sidebending and bil rotation with Lt sided neck pain.  No change in UE symptoms with compression/distraction or movement of the neck.  Pt with reduced Lt UE strength vs Rt and tension and trigger points in the subocipitals and upper traps bilaterally.  Pt will benefit from skilled PT to address neck pain and radiculopathy and allow for return to prior level of function.    Examination-Activity Limitations Sit;Sleep    Examination-Participation Restrictions Community Activity    Stability/Clinical Decision Making Stable/Uncomplicated    Clinical Decision Making Low    Rehab Potential Good    PT Frequency 2x / week    PT Duration 12 weeks    PT Treatment/Interventions ADLs/Self Care Home Management;Cryotherapy;Electrical  Stimulation;Traction;Moist Heat;Functional mobility training;Therapeutic activities;Therapeutic exercise;Manual techniques;Patient/family education;Passive range of motion;Dry needling;Spinal Manipulations;Joint Manipulations;Taping    PT Next Visit Plan dry needling to neck, spinal mobs, traction, review HEP, nerve glides on Lt    PT Home Exercise Plan Access Code: [email or web address]    Consulted and Agree with Plan of Care Patient           Patient will benefit from skilled therapeutic intervention in order to improve the following deficits and impairments:  Decreased activity tolerance, Postural dysfunction, Improper body mechanics, Impaired flexibility, Pain, Increased muscle spasms, Decreased range of motion  Visit Diagnosis: Cervicalgia -  Plan: PT plan of care cert/re-cert  Pain in left arm - Plan: PT plan of care cert/re-cert  Muscle weakness (generalized) - Plan: PT plan of care cert/re-cert  Cramp and spasm - Plan: PT plan of care cert/re-cert     Problem List Patient Active Problem List   Diagnosis Date Noted   Pulmonary emphysema ([place]) 12/09/2018   Loud snoring 12/09/2018   OSA and COPD overlap syndrome ([place]) 12/09/2018     Sigurd Sos, PT 02/24/20 12:59 PM  [facility] [address] [place], [place], [address] Phone: [phone]   Fax:  [phone]  Name: Hao Dion MRN: [id number] Date of Birth: August 04, 1950

## 2020-02-24 NOTE — Patient Instructions (Signed)
Access Code: [email or web address] URL: [email or web address] Date: 02/24/2020 Prepared by: Claiborne Billings  Exercises Seated Cervical Flexion AROM - 3 x daily - 7 x weekly - 3 reps - 1 sets - 20 hold Seated Cervical Sidebending AROM - 3 x daily - 7 x weekly - 1 sets - 3 reps - 20 hold Seated Cervical Rotation AROM - 3 x daily - 7 x weekly - 3 reps - 1 sets - 20 hold Seated Correct Posture - 1 x daily - 7 x weekly - 10 reps - 3 sets

## 2020-03-01 ENCOUNTER — Ambulatory Visit: Payer: Medicare PPO | Admitting: Physical Therapy

## 2020-03-01 ENCOUNTER — Encounter: Payer: Self-pay | Admitting: Physical Therapy

## 2020-03-01 ENCOUNTER — Other Ambulatory Visit: Payer: Self-pay

## 2020-03-01 DIAGNOSIS — M542 Cervicalgia: Secondary | ICD-10-CM | POA: Diagnosis not present

## 2020-03-01 DIAGNOSIS — R252 Cramp and spasm: Secondary | ICD-10-CM

## 2020-03-01 DIAGNOSIS — M6281 Muscle weakness (generalized): Secondary | ICD-10-CM

## 2020-03-01 DIAGNOSIS — M79602 Pain in left arm: Secondary | ICD-10-CM

## 2020-03-01 NOTE — Therapy (Signed)
[facility] [address], [place], [place], [address] Phone: [phone]   Fax:  [phone]  Physical Therapy Treatment  Patient Details  Name: Mark Travis MRN: [id number] Date of Birth: [birth year] Referring Provider (PT): Melina Schools, MD   Encounter Date: 03/01/2020   PT End of Session - 03/01/20 0846    Visit Number 2    Date for PT Re-Evaluation 05/18/20    Authorization Type Cohere: visits requested    PT Start Time 0846    PT Stop Time 0928    PT Time Calculation (min) 42 min    Activity Tolerance Patient tolerated treatment well    Behavior During Therapy [facility] for tasks assessed/performed           Past Medical History:  Diagnosis Date  . Chronic low back pain   . Decreased hearing   . Diverticulosis   . Emphysema (subcutaneous) (surgical) resulting from a procedure   . Fatty liver   . Glaucoma   . Hiatal hernia   . IBS (irritable bowel syndrome)   . OSA on CPAP   . Prostatitis   . Seasonal allergies     Past Surgical History:  Procedure Laterality Date  . COLONOSCOPY  10/2012  . L trigger finger release      There were no vitals filed for this visit.   Subjective Assessment - 03/01/20 0846    Subjective Discomfort today. Shoots down UT Lt> Rt    Pertinent History LBP    Limitations Sitting;Standing;Walking    How long can you sit comfortably? 1 hour max-neck pain    Diagnostic tests MRI: C4,5 and 7- DDD with spurring.    Patient Stated Goals reduce Lt UE pain, reduce neck pain    Currently in Pain? Yes    Pain Score 2     Pain Location Neck    Pain Orientation Right;Left    Pain Descriptors / Indicators Sore                             [facility] Adult PT Treatment/Exercise - 03/01/20 0001      Exercises   Exercises Neck      Neck Exercises: Seated   Neck Retraction 5 reps;5 secs    Cervical Rotation Both    Cervical Rotation Limitations 3 x 20 sec bil     Lateral Flexion Both    Lateral Flexion Limitations 3 x 10 sec bil    Other Seated Exercise neck flex 3x 20 sec      Neck Exercises: Supine   Neck Retraction 5 reps;5 secs      Manual Therapy   Manual Therapy Soft tissue mobilization;Passive ROM;Manual Traction    Soft tissue mobilization to bil UT, levator, cspine and suboccipitals    Passive ROM into lateral flexion; passive stretch of scalenes, levator UT    Manual Traction 2 bouts x 20 sec in supine                  PT Education - 03/01/20 1725    Education Details added cervcial retraction and DN ed    Person(s) Educated Patient    Methods Explanation;Demonstration;Handout    Comprehension Verbalized understanding;Returned demonstration            PT Short Term Goals - 02/24/20 1115      PT SHORT TERM GOAL #1   Title be independent in initial HEP  Time 6    Period Weeks    Status New    Target Date 04/06/20      PT SHORT TERM GOAL #2   Title report 30% reduction in the frequency and intensity of neck pain and Lt UE pain    Time 6    Period Weeks    Status New    Target Date 04/06/20      PT SHORT TERM GOAL #3   Title demonstrate 4+/5 Lt shoulder strength throughout to improve endurance with use    Time 6    Period Weeks    Status New    Target Date 04/06/20             PT Long Term Goals - 02/24/20 1247      PT LONG TERM GOAL #1   Title be independent in advanced HEP    Time 12    Period Weeks    Status New    Target Date 05/18/20      PT LONG TERM GOAL #2   Title reduce FOTO to < or = to 37% limitation    Time 12    Period Weeks    Status New    Target Date 05/18/20      PT LONG TERM GOAL #3   Title report a 70% reduction in the frequency and intensity of neck pain and Lt UE radiculopathy    Time 12    Period Weeks    Status New    Target Date 05/18/20      PT LONG TERM GOAL #4   Title tolerate sitting for 1.5 hours with mini break without increased neck or Lt UE pain     Time 12    Period Weeks    Status New    Target Date 05/18/20      PT LONG TERM GOAL #5   Title demonstarte 5/5 Lt shoulder strength to improve endurance with use    Time 12    Period Weeks    Target Date 05/18/20      Additional Long Term Goals   Additional Long Term Goals Yes      PT LONG TERM GOAL #6   Title return to regular exercise routine at the [facility] without limitation or increased pain    Time 12    Period Weeks    Status New    Target Date 05/18/20                 Plan - 03/01/20 0930    Clinical Impression Statement First f/u visit since eval. HEP reviewed and retraction added. Patient tolerated manual therapy well. He has marked tightness in bil UT, levator, scalenes and suboccipitals. Plan to try DN next visit    PT Frequency 2x / week    PT Duration 12 weeks    PT Treatment/Interventions ADLs/Self Care Home Management;Cryotherapy;Electrical Stimulation;Traction;Moist Heat;Functional mobility training;Therapeutic activities;Therapeutic exercise;Manual techniques;Patient/family education;Passive range of motion;Dry needling;Spinal Manipulations;Joint Manipulations;Taping    PT Next Visit Plan dry needling to neck, spinal mobs, traction, nerve glides on Lt    PT Home Exercise Plan Access Code: [email or web address]    Consulted and Agree with Plan of Care Patient           Patient will benefit from skilled therapeutic intervention in order to improve the following deficits and impairments:  Decreased activity tolerance, Postural dysfunction, Improper body mechanics, Impaired flexibility, Pain, Increased muscle spasms, Decreased range of motion  Visit Diagnosis: Cervicalgia  Pain in left arm  Muscle weakness (generalized)  Cramp and spasm     Problem List Patient Active Problem List   Diagnosis Date Noted  . Pulmonary emphysema ([place]) 12/09/2018  . Loud snoring 12/09/2018  . OSA and COPD overlap syndrome ([place]) 12/09/2018   Madelyn Flavors PT 03/01/2020, 5:28  PM  [facility] [address], [place], [place], [address] Phone: [phone]   Fax:  [phone]  Name: Mark Travis MRN: [id number] Date of Birth: May 07, 1950

## 2020-03-01 NOTE — Patient Instructions (Signed)
Access Code: [email or web address] URL: [email or web address] Date: 03/01/2020 Prepared by: Almyra Free  Exercises Seated Cervical Flexion AROM - 3 x daily - 7 x weekly - 3 reps - 1 sets - 20 hold Seated Cervical Sidebending AROM - 3 x daily - 7 x weekly - 1 sets - 3 reps - 20 hold Seated Cervical Rotation AROM - 3 x daily - 7 x weekly - 3 reps - 1 sets - 20 hold Seated Correct Posture - 1 x daily - 7 x weekly - 10 reps - 3 sets Seated Cervical Retraction - 3 x daily - 7 x weekly - 10 reps - 1 sets - 3-5 sec hold  Patient Education Trigger Point Dry Needling

## 2020-03-02 ENCOUNTER — Encounter: Payer: Self-pay | Admitting: Physical Therapy

## 2020-03-08 ENCOUNTER — Ambulatory Visit: Payer: Medicare PPO | Admitting: Physical Therapy

## 2020-03-08 ENCOUNTER — Encounter: Payer: Self-pay | Admitting: Physical Therapy

## 2020-03-08 ENCOUNTER — Other Ambulatory Visit: Payer: Self-pay

## 2020-03-08 DIAGNOSIS — M542 Cervicalgia: Secondary | ICD-10-CM

## 2020-03-08 DIAGNOSIS — M6281 Muscle weakness (generalized): Secondary | ICD-10-CM

## 2020-03-08 DIAGNOSIS — M79602 Pain in left arm: Secondary | ICD-10-CM

## 2020-03-08 DIAGNOSIS — R252 Cramp and spasm: Secondary | ICD-10-CM

## 2020-03-08 NOTE — Therapy (Signed)
[facility] [address], [place], [place], [address] Phone: [phone]   Fax:  [phone]  Physical Therapy Treatment  Patient Details  Name: Mark Travis MRN: [id number] Date of Birth: [birth year] Referring Provider (PT): Melina Schools, MD   Encounter Date: 03/08/2020   PT End of Session - 03/08/20 1017    Visit Number 3    Date for PT Re-Evaluation 05/18/20    Authorization Type Cohere: visits requested    PT Start Time 1016    PT Stop Time 1056    PT Time Calculation (min) 40 min    Activity Tolerance Patient tolerated treatment well    Behavior During Therapy [facility] for tasks assessed/performed           Past Medical History:  Diagnosis Date  . Chronic low back pain   . Decreased hearing   . Diverticulosis   . Emphysema (subcutaneous) (surgical) resulting from a procedure   . Fatty liver   . Glaucoma   . Hiatal hernia   . IBS (irritable bowel syndrome)   . OSA on CPAP   . Prostatitis   . Seasonal allergies     Past Surgical History:  Procedure Laterality Date  . COLONOSCOPY  10/2012  . L trigger finger release      There were no vitals filed for this visit.   Subjective Assessment - 03/08/20 1023    Subjective Neck is stiff but no pain this AM    Diagnostic tests MRI: C4,5 and 7- DDD with spurring.    Patient Stated Goals reduce Lt UE pain, reduce neck pain    Currently in Pain? No/denies    Multiple Pain Sites No                             [facility] Adult PT Treatment/Exercise - 03/08/20 0001      Manual Therapy   Manual Therapy Soft tissue mobilization;Passive ROM;Manual Traction    Soft tissue mobilization to bil UT, levator, cspine and suboccipitals    Passive ROM into lateral flexion; passive stretch of scalenes, levator UT    Manual Traction 2 bouts x 20 sec in supine                    PT Short Term Goals - 02/24/20 1115      PT SHORT TERM GOAL #1    Title be independent in initial HEP    Time 6    Period Weeks    Status New    Target Date 04/06/20      PT SHORT TERM GOAL #2   Title report 30% reduction in the frequency and intensity of neck pain and Lt UE pain    Time 6    Period Weeks    Status New    Target Date 04/06/20      PT SHORT TERM GOAL #3   Title demonstrate 4+/5 Lt shoulder strength throughout to improve endurance with use    Time 6    Period Weeks    Status New    Target Date 04/06/20             PT Long Term Goals - 02/24/20 1247      PT LONG TERM GOAL #1   Title be independent in advanced HEP    Time 12    Period Weeks    Status New    Target Date  05/18/20      PT LONG TERM GOAL #2   Title reduce FOTO to < or = to 37% limitation    Time 12    Period Weeks    Status New    Target Date 05/18/20      PT LONG TERM GOAL #3   Title report a 70% reduction in the frequency and intensity of neck pain and Lt UE radiculopathy    Time 12    Period Weeks    Status New    Target Date 05/18/20      PT LONG TERM GOAL #4   Title tolerate sitting for 1.5 hours with mini break without increased neck or Lt UE pain    Time 12    Period Weeks    Status New    Target Date 05/18/20      PT LONG TERM GOAL #5   Title demonstarte 5/5 Lt shoulder strength to improve endurance with use    Time 12    Period Weeks    Target Date 05/18/20      Additional Long Term Goals   Additional Long Term Goals Yes      PT LONG TERM GOAL #6   Title return to regular exercise routine at the [facility] without limitation or increased pain    Time 12    Period Weeks    Status New    Target Date 05/18/20                 Plan - 03/08/20 1057    Clinical Impression Statement Pt arrives with complaints of a "really stiff neck this AM." Pt completed his stretching prior to coming to PT today with little change. Mark Travis performed soft tissue work to Bil cervical with PROM. Pt reported stiffness improved by "at least half" at  end of session. Pt would like to try the dry needling when he sees the PT in 2 weeks.    Examination-Activity Limitations Sit;Sleep    Examination-Participation Restrictions Community Activity    Stability/Clinical Decision Making Stable/Uncomplicated    Rehab Potential Good    PT Frequency 2x / week    PT Duration 12 weeks    PT Treatment/Interventions ADLs/Self Care Home Management;Cryotherapy;Electrical Stimulation;Traction;Moist Heat;Functional mobility training;Therapeutic activities;Therapeutic exercise;Manual techniques;Patient/family education;Passive range of motion;Dry needling;Spinal Manipulations;Joint Manipulations;Taping    PT Next Visit Plan dry needling to neck, spinal mobs, traction, nerve glides on Lt    PT Home Exercise Plan Access Code: [email or web address]    Consulted and Agree with Plan of Care Patient           Patient will benefit from skilled therapeutic intervention in order to improve the following deficits and impairments:  Decreased activity tolerance, Postural dysfunction, Improper body mechanics, Impaired flexibility, Pain, Increased muscle spasms, Decreased range of motion  Visit Diagnosis: Cervicalgia  Pain in left arm  Muscle weakness (generalized)  Cramp and spasm     Problem List Patient Active Problem List   Diagnosis Date Noted  . Pulmonary emphysema ([place]) 12/09/2018  . Loud snoring 12/09/2018  . OSA and COPD overlap syndrome ([place]) 12/09/2018    Mark Travis, Mark Travis 03/08/2020, 10:59 AM  [facility] [address], [place], [place], [address] Phone: [phone]   Fax:  [phone]  Name: Mark Travis MRN: [id number] Date of Birth: 10/17/1950

## 2020-03-20 ENCOUNTER — Other Ambulatory Visit: Payer: Self-pay

## 2020-03-20 ENCOUNTER — Ambulatory Visit: Payer: Medicare PPO | Attending: Surgery | Admitting: Physical Therapy

## 2020-03-20 ENCOUNTER — Encounter: Payer: Self-pay | Admitting: Physical Therapy

## 2020-03-20 DIAGNOSIS — R252 Cramp and spasm: Secondary | ICD-10-CM | POA: Insufficient documentation

## 2020-03-20 DIAGNOSIS — M6281 Muscle weakness (generalized): Secondary | ICD-10-CM | POA: Diagnosis present

## 2020-03-20 DIAGNOSIS — M542 Cervicalgia: Secondary | ICD-10-CM | POA: Diagnosis not present

## 2020-03-20 DIAGNOSIS — M79602 Pain in left arm: Secondary | ICD-10-CM | POA: Diagnosis present

## 2020-03-20 NOTE — Therapy (Signed)
[facility] [address] [place], [place], [address] Phone: [phone]   Fax:  [phone]  Physical Therapy Treatment  Patient Details  Name: Mark Travis MRN: [id number] Date of Birth: 10/12/1950 Referring Provider (PT): Melina Schools, MD   Encounter Date: 03/20/2020   PT End of Session - 03/20/20 1442    Visit Number 4    Date for PT Re-Evaluation 05/18/20    Authorization Type Cohere: visits requested    PT Start Time 1440    PT Stop Time 1522    PT Time Calculation (min) 42 min    Activity Tolerance Patient tolerated treatment well    Behavior During Therapy [facility] for tasks assessed/performed           Past Medical History:  Diagnosis Date  . Chronic low back pain   . Decreased hearing   . Diverticulosis   . Emphysema (subcutaneous) (surgical) resulting from a procedure   . Fatty liver   . Glaucoma   . Hiatal hernia   . IBS (irritable bowel syndrome)   . OSA on CPAP   . Prostatitis   . Seasonal allergies     Past Surgical History:  Procedure Laterality Date  . COLONOSCOPY  10/2012  . L trigger finger release      There were no vitals filed for this visit.                      [place] Adult PT Treatment/Exercise - 03/20/20 0001      Neck Exercises: Machines for Strengthening   Cybex Row 1 plates 2x10     Lat Pull 20# 2x10     Other Machines for Strengthening Standing arm extensions 1 plate 2x10       Neck Exercises: Standing   Other Standing Exercises 8# standing bicep curls 10x hammer, 10x with supination     Other Standing Exercises 3# 3 way raise 10x each       Manual Therapy   Manual Therapy Soft tissue mobilization;Passive ROM;Manual Traction    Soft tissue mobilization to bil UT, levator, cspine and suboccipitals    Passive ROM into lateral flexion; passive stretch of scalenes, levator UT    Manual Traction 2 bouts x 20 sec in supine                     PT Short Term Goals - 02/24/20 1115      PT SHORT TERM GOAL #1   Title be independent in initial HEP    Time 6    Period Weeks    Status New    Target Date 04/06/20      PT SHORT TERM GOAL #2   Title report 30% reduction in the frequency and intensity of neck pain and Lt UE pain    Time 6    Period Weeks    Status New    Target Date 04/06/20      PT SHORT TERM GOAL #3   Title demonstrate 4+/5 Lt shoulder strength throughout to improve endurance with use    Time 6    Period Weeks    Status New    Target Date 04/06/20             PT Long Term Goals - 02/24/20 1247      PT LONG TERM GOAL #1   Title be independent in advanced HEP    Time 12    Period  Weeks    Status New    Target Date 05/18/20      PT LONG TERM GOAL #2   Title reduce FOTO to < or = to 37% limitation    Time 12    Period Weeks    Status New    Target Date 05/18/20      PT LONG TERM GOAL #3   Title report a 70% reduction in the frequency and intensity of neck pain and Lt UE radiculopathy    Time 12    Period Weeks    Status New    Target Date 05/18/20      PT LONG TERM GOAL #4   Title tolerate sitting for 1.5 hours with mini break without increased neck or Lt UE pain    Time 12    Period Weeks    Status New    Target Date 05/18/20      PT LONG TERM GOAL #5   Title demonstarte 5/5 Lt shoulder strength to improve endurance with use    Time 12    Period Weeks    Target Date 05/18/20      Additional Long Term Goals   Additional Long Term Goals Yes      PT LONG TERM GOAL #6   Title return to regular exercise routine at the [facility] without limitation or increased pain    Time 12    Period Weeks    Status New    Target Date 05/18/20                 Plan - 03/20/20 1514    Clinical Impression Statement Pt arrives with reports of "much less pain and stifness. " He reports " i think I am ready to return to the gym." Resting tone to cervical muscles improving. Pt  was able to participate in a light PRE routine today involving the gym equipment and free weights. Pt could feel cervical muscles "tightening up a bit" at the end of the session.    Examination-Activity Limitations Sit;Sleep    Examination-Participation Restrictions Community Activity    Stability/Clinical Decision Making Stable/Uncomplicated    Rehab Potential Good    PT Frequency 2x / week    PT Treatment/Interventions ADLs/Self Care Home Management;Cryotherapy;Electrical Stimulation;Traction;Moist Heat;Functional mobility training;Therapeutic activities;Therapeutic exercise;Manual techniques;Patient/family education;Passive range of motion;Dry needling;Spinal Manipulations;Joint Manipulations;Taping    PT Next Visit Plan Check response to weights/resisatnce training    PT Home Exercise Plan Access Code: [email or web address]    Consulted and Agree with Plan of Care Patient           Patient will benefit from skilled therapeutic intervention in order to improve the following deficits and impairments:  Decreased activity tolerance, Postural dysfunction, Improper body mechanics, Impaired flexibility, Pain, Increased muscle spasms, Decreased range of motion  Visit Diagnosis: Cervicalgia  Pain in left arm  Muscle weakness (generalized)  Cramp and spasm     Problem List Patient Active Problem List   Diagnosis Date Noted  . Pulmonary emphysema ([place]) 12/09/2018  . Loud snoring 12/09/2018  . OSA and COPD overlap syndrome ([place]) 12/09/2018    Octavie Westerhold, PTA 03/20/2020, 3:24 PM  [facility] [address] [place], [place], [address] Phone: [phone]   Fax:  [phone]  Name: Mark Travis MRN: [id number] Date of Birth: [birth year]

## 2020-03-24 ENCOUNTER — Ambulatory Visit: Payer: Medicare PPO | Admitting: Physical Therapy

## 2020-03-24 ENCOUNTER — Encounter: Payer: Self-pay | Admitting: Physical Therapy

## 2020-03-24 ENCOUNTER — Other Ambulatory Visit: Payer: Self-pay

## 2020-03-24 DIAGNOSIS — R252 Cramp and spasm: Secondary | ICD-10-CM

## 2020-03-24 DIAGNOSIS — M79602 Pain in left arm: Secondary | ICD-10-CM

## 2020-03-24 DIAGNOSIS — M542 Cervicalgia: Secondary | ICD-10-CM

## 2020-03-24 DIAGNOSIS — M6281 Muscle weakness (generalized): Secondary | ICD-10-CM

## 2020-03-24 NOTE — Therapy (Signed)
[facility] [address], [place], [place], [address] Phone: [phone]   Fax:  [phone]  Physical Therapy Treatment  Patient Details  Name: Mark Travis MRN: [id number] Date of Birth: [birth year] Referring Provider (PT): Melina Schools, MD   Encounter Date: 03/24/2020   PT End of Session - 03/24/20 0925    Visit Number 5    Date for PT Re-Evaluation 05/18/20    Authorization Type Cohere: visits requested    PT Start Time 0926    PT Stop Time 1005    PT Time Calculation (min) 39 min    Activity Tolerance Patient tolerated treatment well    Behavior During Therapy [facility] for tasks assessed/performed           Past Medical History:  Diagnosis Date  . Chronic low back pain   . Decreased hearing   . Diverticulosis   . Emphysema (subcutaneous) (surgical) resulting from a procedure   . Fatty liver   . Glaucoma   . Hiatal hernia   . IBS (irritable bowel syndrome)   . OSA on CPAP   . Prostatitis   . Seasonal allergies     Past Surgical History:  Procedure Laterality Date  . COLONOSCOPY  10/2012  . L trigger finger release      There were no vitals filed for this visit.   Subjective Assessment - 03/24/20 0926    Subjective I was a little sore after last session when we lifted but it went away by the end of the day.    Pertinent History LBP    Currently in Pain? No/denies    Multiple Pain Sites No                             [facility] Adult PT Treatment/Exercise - 03/24/20 0001      Neck Exercises: Machines for Strengthening   UBE (Upper Arm Bike) L1 2x2    Cybex Row 2 plates 2x10    Lat Pull 20# 2x10     Other Machines for Strengthening Standing arm extensions 1 plate 2x10       Manual Therapy   Manual Therapy Soft tissue mobilization;Passive ROM;Manual Traction    Soft tissue mobilization to bil UT, levator, cspine and suboccipitals    Passive ROM into lateral flexion; passive  stretch of scalenes, levator UT    Manual Traction 2 bouts x 20 sec in supine                    PT Short Term Goals - 02/24/20 1115      PT SHORT TERM GOAL #1   Title be independent in initial HEP    Time 6    Period Weeks    Status New    Target Date 04/06/20      PT SHORT TERM GOAL #2   Title report 30% reduction in the frequency and intensity of neck pain and Lt UE pain    Time 6    Period Weeks    Status New    Target Date 04/06/20      PT SHORT TERM GOAL #3   Title demonstrate 4+/5 Lt shoulder strength throughout to improve endurance with use    Time 6    Period Weeks    Status New    Target Date 04/06/20             PT Long Term  Goals - 02/24/20 1247      PT LONG TERM GOAL #1   Title be independent in advanced HEP    Time 12    Period Weeks    Status New    Target Date 05/18/20      PT LONG TERM GOAL #2   Title reduce FOTO to < or = to 37% limitation    Time 12    Period Weeks    Status New    Target Date 05/18/20      PT LONG TERM GOAL #3   Title report a 70% reduction in the frequency and intensity of neck pain and Lt UE radiculopathy    Time 12    Period Weeks    Status New    Target Date 05/18/20      PT LONG TERM GOAL #4   Title tolerate sitting for 1.5 hours with mini break without increased neck or Lt UE pain    Time 12    Period Weeks    Status New    Target Date 05/18/20      PT LONG TERM GOAL #5   Title demonstarte 5/5 Lt shoulder strength to improve endurance with use    Time 12    Period Weeks    Target Date 05/18/20      Additional Long Term Goals   Additional Long Term Goals Yes      PT LONG TERM GOAL #6   Title return to regular exercise routine at the [facility] without limitation or increased pain    Time 12    Period Weeks    Status New    Target Date 05/18/20                 Plan - 03/24/20 3785    Clinical Impression Statement Pt responded well to the addition/progression of resistance training to  his program. Pt reports he "he is ready to return to the gym." Pt's parameters were only minimally adjusted/progressed today as he did get sore x 12 hrs post last session and his level of stenosis. Today we reversed teh sesison where he did his exercises first then received soft tissue mobilization which seemed to be a better routine.    Examination-Activity Limitations Sit;Sleep    Examination-Participation Restrictions Community Activity    Stability/Clinical Decision Making Stable/Uncomplicated    Rehab Potential Good    PT Frequency 2x / week    PT Duration 12 weeks    PT Treatment/Interventions ADLs/Self Care Home Management;Cryotherapy;Electrical Stimulation;Traction;Moist Heat;Functional mobility training;Therapeutic activities;Therapeutic exercise;Manual techniques;Patient/family education;Passive range of motion;Dry needling;Spinal Manipulations;Joint Manipulations;Taping    PT Next Visit Plan Cont with transition to gym/gym exercises and soft tissue to cervical post. AROM measurements in next 1-2 visits    PT Home Exercise Plan Access Code: [email or web address]    Consulted and Agree with Plan of Care Patient           Patient will benefit from skilled therapeutic intervention in order to improve the following deficits and impairments:  Decreased activity tolerance,Postural dysfunction,Improper body mechanics,Impaired flexibility,Pain,Increased muscle spasms,Decreased range of motion  Visit Diagnosis: Cervicalgia  Pain in left arm  Muscle weakness (generalized)  Cramp and spasm     Problem List Patient Active Problem List   Diagnosis Date Noted  . Pulmonary emphysema ([place]) 12/09/2018  . Loud snoring 12/09/2018  . OSA and COPD overlap syndrome ([place]) 12/09/2018    Mark Travis, PTA 03/24/2020, 10:11 AM  [facility] [address], [place], [place], [address] Phone: [phone]   Fax:  [phone]  Name: Mark Travis MRN: [id number] Date of Birth: 11-14-1950

## 2020-03-27 ENCOUNTER — Other Ambulatory Visit: Payer: Self-pay

## 2020-03-27 ENCOUNTER — Ambulatory Visit: Payer: Medicare PPO | Admitting: Physical Therapy

## 2020-03-27 ENCOUNTER — Encounter: Payer: Self-pay | Admitting: Physical Therapy

## 2020-03-27 DIAGNOSIS — M6281 Muscle weakness (generalized): Secondary | ICD-10-CM

## 2020-03-27 DIAGNOSIS — M79602 Pain in left arm: Secondary | ICD-10-CM

## 2020-03-27 DIAGNOSIS — M542 Cervicalgia: Secondary | ICD-10-CM | POA: Diagnosis not present

## 2020-03-27 DIAGNOSIS — R252 Cramp and spasm: Secondary | ICD-10-CM

## 2020-03-27 NOTE — Therapy (Signed)
[facility] [address] [place], [place], [address] Phone: [phone]   Fax:  [phone]  Physical Therapy Treatment  Patient Details  Name: Mark Travis MRN: [id number] Date of Birth: 1950-08-25 Referring Provider (PT): Melina Schools, MD   Encounter Date: 03/27/2020   PT End of Session - 03/27/20 1559    Visit Number 6    Date for PT Re-Evaluation 05/18/20    Authorization Type Cohere: visits requested    PT Start Time 1520    PT Stop Time 1600    PT Time Calculation (min) 40 min           Past Medical History:  Diagnosis Date  . Chronic low back pain   . Decreased hearing   . Diverticulosis   . Emphysema (subcutaneous) (surgical) resulting from a procedure   . Fatty liver   . Glaucoma   . Hiatal hernia   . IBS (irritable bowel syndrome)   . OSA on CPAP   . Prostatitis   . Seasonal allergies     Past Surgical History:  Procedure Laterality Date  . COLONOSCOPY  10/2012  . L trigger finger release      There were no vitals filed for this visit.   Subjective Assessment - 03/27/20 1528    Subjective Just a little sore after last time but less than the previous. No pain today, a lot going on at home taking my wife to MD visits.    Diagnostic tests MRI: C4,5 and 7- DDD with spurring.    Currently in Pain? No/denies    Multiple Pain Sites No                             [facility] Adult PT Treatment/Exercise - 03/27/20 0001      Neck Exercises: Machines for Strengthening   UBE (Upper Arm Bike) L1 2x2    Cybex Row 2 plates 2x10   Min VC for posture   Lat Pull 20# 2x15    Other Machines for Strengthening Standing arm extensions 1 plate 2x10       Neck Exercises: Standing   Other Standing Exercises 8# standing bicep curls 10x hammer, 10x with supination     Other Standing Exercises 3# 3 way raise 10x each       Manual Therapy   Manual Therapy Soft tissue mobilization;Passive  ROM;Manual Traction    Soft tissue mobilization to bil UT, levator, cspine and suboccipitals    Passive ROM into lateral flexion; passive stretch of scalenes, levator UT    Manual Traction 2 bouts x 20 sec in supine                    PT Short Term Goals - 02/24/20 1115      PT SHORT TERM GOAL #1   Title be independent in initial HEP    Time 6    Period Weeks    Status New    Target Date 04/06/20      PT SHORT TERM GOAL #2   Title report 30% reduction in the frequency and intensity of neck pain and Lt UE pain    Time 6    Period Weeks    Status New    Target Date 04/06/20      PT SHORT TERM GOAL #3   Title demonstrate 4+/5 Lt shoulder strength throughout to improve endurance with use  Time 6    Period Weeks    Status New    Target Date 04/06/20             PT Long Term Goals - 02/24/20 1247      PT LONG TERM GOAL #1   Title be independent in advanced HEP    Time 12    Period Weeks    Status New    Target Date 05/18/20      PT LONG TERM GOAL #2   Title reduce FOTO to < or = to 37% limitation    Time 12    Period Weeks    Status New    Target Date 05/18/20      PT LONG TERM GOAL #3   Title report a 70% reduction in the frequency and intensity of neck pain and Lt UE radiculopathy    Time 12    Period Weeks    Status New    Target Date 05/18/20      PT LONG TERM GOAL #4   Title tolerate sitting for 1.5 hours with mini break without increased neck or Lt UE pain    Time 12    Period Weeks    Status New    Target Date 05/18/20      PT LONG TERM GOAL #5   Title demonstarte 5/5 Lt shoulder strength to improve endurance with use    Time 12    Period Weeks    Target Date 05/18/20      Additional Long Term Goals   Additional Long Term Goals Yes      PT LONG TERM GOAL #6   Title return to regular exercise routine at the [facility] without limitation or increased pain    Time 12    Period Weeks    Status New    Target Date 05/18/20                  Plan - 03/27/20 1535    Clinical Impression Statement Pt reports he is doing well especially as his wife is dealing with some stressful situations. He is doing well with returning to gym equipment, has a little soreness after but typically abolishes by the next day. Cervical resting tone was more tense than it had been, but responded well to todays soft tissue work.    Examination-Activity Limitations Sit;Sleep    Examination-Participation Restrictions Community Activity    Stability/Clinical Decision Making Stable/Uncomplicated    Rehab Potential Good    PT Frequency 2x / week    PT Duration 12 weeks    PT Treatment/Interventions ADLs/Self Care Home Management;Cryotherapy;Electrical Stimulation;Traction;Moist Heat;Functional mobility training;Therapeutic activities;Therapeutic exercise;Manual techniques;Patient/family education;Passive range of motion;Dry needling;Spinal Manipulations;Joint Manipulations;Taping    PT Next Visit Plan Pt would like dry needling next session to cervical, upper traps    PT Home Exercise Plan Access Code: [email or web address]    Consulted and Agree with Plan of Care Patient           Patient will benefit from skilled therapeutic intervention in order to improve the following deficits and impairments:  Decreased activity tolerance,Postural dysfunction,Improper body mechanics,Impaired flexibility,Pain,Increased muscle spasms,Decreased range of motion  Visit Diagnosis: Cervicalgia  Pain in left arm  Muscle weakness (generalized)  Cramp and spasm     Problem List Patient Active Problem List   Diagnosis Date Noted  . Pulmonary emphysema ([place]) 12/09/2018  . Loud snoring 12/09/2018  . OSA and COPD overlap syndrome ([place]) 12/09/2018    Mark Travis,  PTA 03/27/2020, 4:06 PM  [facility] [address], [place], [place], [address] Phone: [phone]   Fax:  [phone]  Name: Mark Travis MRN: [id number] Date of Birth: Jun 20, 1950

## 2020-03-29 ENCOUNTER — Other Ambulatory Visit: Payer: Self-pay

## 2020-03-29 ENCOUNTER — Encounter: Payer: Self-pay | Admitting: Physical Therapy

## 2020-03-29 ENCOUNTER — Ambulatory Visit: Payer: Medicare PPO | Admitting: Physical Therapy

## 2020-03-29 DIAGNOSIS — R252 Cramp and spasm: Secondary | ICD-10-CM

## 2020-03-29 DIAGNOSIS — M6281 Muscle weakness (generalized): Secondary | ICD-10-CM

## 2020-03-29 DIAGNOSIS — M542 Cervicalgia: Secondary | ICD-10-CM

## 2020-03-29 DIAGNOSIS — M79602 Pain in left arm: Secondary | ICD-10-CM

## 2020-03-29 NOTE — Therapy (Signed)
[facility] [address] [place], [place], [address] Phone: [phone]   Fax:  [phone]  Physical Therapy Treatment  Patient Details  Name: Mark Travis MRN: [id number] Date of Birth: 29-Jun-1950 Referring Provider (PT): Melina Schools, MD   Encounter Date: 03/29/2020   PT End of Session - 03/29/20 1059    Visit Number 7    Date for PT Re-Evaluation 05/18/20    Authorization Type Cohere: visits requested    PT Start Time 1100    PT Stop Time 1142    PT Time Calculation (min) 42 min    Activity Tolerance Patient tolerated treatment well    Behavior During Therapy [facility] for tasks assessed/performed           Past Medical History:  Diagnosis Date  . Chronic low back pain   . Decreased hearing   . Diverticulosis   . Emphysema (subcutaneous) (surgical) resulting from a procedure   . Fatty liver   . Glaucoma   . Hiatal hernia   . IBS (irritable bowel syndrome)   . OSA on CPAP   . Prostatitis   . Seasonal allergies     Past Surgical History:  Procedure Laterality Date  . COLONOSCOPY  10/2012  . L trigger finger release      There were no vitals filed for this visit.   Subjective Assessment - 03/29/20 1103    Subjective Minor aches today but not too bad.    Diagnostic tests MRI: C4,5 and 7- DDD with spurring.    Patient Stated Goals reduce Lt UE pain, reduce neck pain    Currently in Pain? No/denies              [facility] PT Assessment - 03/29/20 0001      Strength   Overall Strength Comments Lt shoulder ABD 4+/5, else 5-/5 to 5/5; Rt shoulder ABD and ER 4+/5 else 5/5      Palpation   Palpation comment TPs in bil UT, levator scapulae, suboccipitals and cervical paraspinals                         [facility] Adult PT Treatment/Exercise - 03/29/20 0001      Neck Exercises: Machines for Strengthening   UBE (Upper Arm Bike) L1 2x2    Cybex Row 25# 2x10    Lat Pull 20# 3x10 (2 facing  out/ 1 facing in)    Other Machines for Strengthening Standing arm extensions 15# 2x10      Manual Therapy   Manual Therapy Soft tissue mobilization    Manual therapy comments Skilled palpation and monitoring of soft tissues during DN    Soft tissue mobilization to bil UT, levator, cspine and suboccipitals      Neck Exercises: Stretches   Upper Trapezius Stretch Right;Left;10 seconds;2 reps            Trigger Point Dry Needling - 03/29/20 0001    Consent Given? Yes    Education Handout Provided Previously provided    Muscles Treated Head and Neck Upper trapezius;Levator scapulae;Suboccipitals;Cervical multifidi    Dry Needling Comments bil    Upper Trapezius Response Twitch reponse elicited;Palpable increased muscle length    Suboccipitals Response Twitch response elicited;Palpable increased muscle length    Levator Scapulae Response Palpable increased muscle length    Cervical multifidi Response Twitch reponse elicited;Palpable increased muscle length  PT Short Term Goals - 03/29/20 1115      PT SHORT TERM GOAL #1   Title be independent in initial HEP    Status Achieved      PT SHORT TERM GOAL #2   Title report 30% reduction in the frequency and intensity of neck pain and Lt UE pain    Baseline able to do push up the other day; first time in months    Status Achieved      PT SHORT TERM GOAL #3   Title demonstrate 4+/5 Lt shoulder strength throughout to improve endurance with use    Status Achieved             PT Long Term Goals - 03/29/20 1708      PT LONG TERM GOAL #5   Title demonstarte 5/5 Lt shoulder strength to improve endurance with use    Status Partially Met                 Plan - 03/29/20 1711    Clinical Impression Statement Patient is progressing toward LTGs. He has met all STGs. His left shoulder strength is >= 4+/5 and he was able to do a push up the other day for the first time in months. Headaches have resolved and  pain has decreased considerably. He is still very tight in cervical musculature. We did an initial trial of DN today with very good results bilaterally.    PT Treatment/Interventions ADLs/Self Care Home Management;Cryotherapy;Electrical Stimulation;Traction;Moist Heat;Functional mobility training;Therapeutic activities;Therapeutic exercise;Manual techniques;Patient/family education;Passive range of motion;Dry needling;Spinal Manipulations;Joint Manipulations;Taping    PT Next Visit Plan assess DN, continue as indicated. Assess LTGs.    PT Home Exercise Plan Access Code: [email or web address]           Patient will benefit from skilled therapeutic intervention in order to improve the following deficits and impairments:  Decreased activity tolerance,Postural dysfunction,Improper body mechanics,Impaired flexibility,Pain,Increased muscle spasms,Decreased range of motion  Visit Diagnosis: Cervicalgia  Pain in left arm  Muscle weakness (generalized)  Cramp and spasm     Problem List Patient Active Problem List   Diagnosis Date Noted  . Pulmonary emphysema ([place]) 12/09/2018  . Loud snoring 12/09/2018  . OSA and COPD overlap syndrome ([place]) 12/09/2018   Madelyn Flavors PT 03/29/2020, 5:15 PM  [facility] [address] [place], [place], [address] Phone: [phone]   Fax:  [phone]  Name: Tajae Rybicki MRN: [id number] Date of Birth: 14-Mar-1951

## 2020-04-03 ENCOUNTER — Encounter: Payer: Self-pay | Admitting: Physical Therapy

## 2020-04-03 ENCOUNTER — Other Ambulatory Visit: Payer: Self-pay

## 2020-04-03 ENCOUNTER — Ambulatory Visit: Payer: Medicare PPO | Admitting: Physical Therapy

## 2020-04-03 DIAGNOSIS — R252 Cramp and spasm: Secondary | ICD-10-CM

## 2020-04-03 DIAGNOSIS — M542 Cervicalgia: Secondary | ICD-10-CM

## 2020-04-03 DIAGNOSIS — M6281 Muscle weakness (generalized): Secondary | ICD-10-CM

## 2020-04-03 DIAGNOSIS — M79602 Pain in left arm: Secondary | ICD-10-CM

## 2020-04-03 NOTE — Therapy (Signed)
[facility] [address] [place], [place], [address] Phone: [phone]   Fax:  [phone]  Physical Therapy Treatment  Patient Details  Name: Mark Travis MRN: [id number] Date of Birth: 07/09/1950 Referring Provider (PT): Melina Schools, MD   Encounter Date: 04/03/2020   PT End of Session - 04/03/20 1527    Visit Number 8    Date for PT Re-Evaluation 05/18/20    Authorization Type Cohere: visits requested    PT Start Time 1522    PT Stop Time 1601    PT Time Calculation (min) 39 min    Activity Tolerance Patient tolerated treatment well    Behavior During Therapy [facility] for tasks assessed/performed           Past Medical History:  Diagnosis Date  . Chronic low back pain   . Decreased hearing   . Diverticulosis   . Emphysema (subcutaneous) (surgical) resulting from a procedure   . Fatty liver   . Glaucoma   . Hiatal hernia   . IBS (irritable bowel syndrome)   . OSA on CPAP   . Prostatitis   . Seasonal allergies     Past Surgical History:  Procedure Laterality Date  . COLONOSCOPY  10/2012  . L trigger finger release      There were no vitals filed for this visit.   Subjective Assessment - 04/03/20 1528    Subjective I was extremely sore after the needling, had muscle spasms and even my balance felt off for a few days. I never had that day where I felt better from the neelding.    Currently in Pain? No/denies    Multiple Pain Sites No                             [facility] Adult PT Treatment/Exercise - 04/03/20 0001      Manual Therapy   Manual Therapy Soft tissue mobilization;Passive ROM;Manual Traction    Soft tissue mobilization to bil UT, levator, cspine and suboccipitals    Passive ROM into lateral flexion; passive stretch of scalenes, levator UT                    PT Short Term Goals - 03/29/20 1115      PT SHORT TERM GOAL #1   Title be independent in initial  HEP    Status Achieved      PT SHORT TERM GOAL #2   Title report 30% reduction in the frequency and intensity of neck pain and Lt UE pain    Baseline able to do push up the other day; first time in months    Status Achieved      PT SHORT TERM GOAL #3   Title demonstrate 4+/5 Lt shoulder strength throughout to improve endurance with use    Status Achieved             PT Long Term Goals - 03/29/20 1708      PT LONG TERM GOAL #5   Title demonstarte 5/5 Lt shoulder strength to improve endurance with use    Status Partially Met                 Plan - 04/03/20 1528    Clinical Impression Statement Pt arrives today with no pain but post dry needling pt had increased pain and spasms for 2-3 days and was quite sore. Todays session mainly focused on  soft tissue work to reduce muscle spasm and increase ROM. Pt responds well to soft tissue work and stretching.    Examination-Activity Limitations Sit;Sleep    Examination-Participation Restrictions Community Activity    Stability/Clinical Decision Making Stable/Uncomplicated    Rehab Potential Good    PT Frequency 2x / week    PT Duration 12 weeks    PT Treatment/Interventions ADLs/Self Care Home Management;Cryotherapy;Electrical Stimulation;Traction;Moist Heat;Functional mobility training;Therapeutic activities;Therapeutic exercise;Manual techniques;Patient/family education;Passive range of motion;Dry needling;Spinal Manipulations;Joint Manipulations;Taping    PT Next Visit Plan Soft tissue, review LTG    PT Home Exercise Plan Access Code: [email or web address]    Consulted and Agree with Plan of Care Patient           Patient will benefit from skilled therapeutic intervention in order to improve the following deficits and impairments:  Decreased activity tolerance,Postural dysfunction,Improper body mechanics,Impaired flexibility,Pain,Increased muscle spasms,Decreased range of motion  Visit Diagnosis: Cervicalgia  Pain in left  arm  Muscle weakness (generalized)  Cramp and spasm     Problem List Patient Active Problem List   Diagnosis Date Noted  . Pulmonary emphysema ([place]) 12/09/2018  . Loud snoring 12/09/2018  . OSA and COPD overlap syndrome ([place]) 12/09/2018    [name], PTA 04/03/2020, 4:01 PM  [facility] [address] [place], [place], [address] Phone: [phone]   Fax:  [phone]  Name: Mark Travis MRN: [id number] Date of Birth: [birth year]

## 2020-04-05 ENCOUNTER — Other Ambulatory Visit: Payer: Self-pay

## 2020-04-05 ENCOUNTER — Ambulatory Visit: Payer: Medicare PPO | Admitting: Physical Therapy

## 2020-04-05 ENCOUNTER — Encounter: Payer: Self-pay | Admitting: Physical Therapy

## 2020-04-05 DIAGNOSIS — M542 Cervicalgia: Secondary | ICD-10-CM

## 2020-04-05 DIAGNOSIS — M79602 Pain in left arm: Secondary | ICD-10-CM

## 2020-04-05 DIAGNOSIS — R252 Cramp and spasm: Secondary | ICD-10-CM

## 2020-04-05 DIAGNOSIS — M6281 Muscle weakness (generalized): Secondary | ICD-10-CM

## 2020-04-05 NOTE — Therapy (Signed)
[facility] [address] [place], [place], [address] Phone: [phone]   Fax:  [phone]  Physical Therapy Treatment  Patient Details  Name: Mark Travis MRN: [id number] Date of Birth: 1950/09/19 Referring Provider (PT): Melina Schools, MD   Encounter Date: 04/05/2020   PT End of Session - 04/05/20 0925    Visit Number 9    Date for PT Re-Evaluation 05/18/20    Authorization Type Cohere: visits requested    PT Start Time 0921    PT Stop Time 0959    PT Time Calculation (min) 38 min    Activity Tolerance Patient tolerated treatment well    Behavior During Therapy [facility] for tasks assessed/performed           Past Medical History:  Diagnosis Date  . Chronic low back pain   . Decreased hearing   . Diverticulosis   . Emphysema (subcutaneous) (surgical) resulting from a procedure   . Fatty liver   . Glaucoma   . Hiatal hernia   . IBS (irritable bowel syndrome)   . OSA on CPAP   . Prostatitis   . Seasonal allergies     Past Surgical History:  Procedure Laterality Date  . COLONOSCOPY  10/2012  . L trigger finger release      There were no vitals filed for this visit.   Subjective Assessment - 04/05/20 0926    Subjective My upper body and neck felt much better after last session. My balance does seem to be off but it is improving since over the weekend.    Currently in Pain? No/denies                             [facility] Adult PT Treatment/Exercise - 04/05/20 0001      Neck Exercises: Machines for Strengthening   UBE (Upper Arm Bike) L1 3x3 for warm up and discussion of status      Neck Exercises: Standing   Other Standing Exercises Green/blue rows & extensions 10x each, pt given both colors for HEP                  PT Education - 04/05/20 0930    Education Details Tband HEP for posture muscles    Person(s) Educated Patient    Methods Explanation;Demonstration;Verbal  cues;Handout    Comprehension Returned demonstration;Verbalized understanding            PT Short Term Goals - 03/29/20 1115      PT SHORT TERM GOAL #1   Title be independent in initial HEP    Status Achieved      PT SHORT TERM GOAL #2   Title report 30% reduction in the frequency and intensity of neck pain and Lt UE pain    Baseline able to do push up the other day; first time in months    Status Achieved      PT SHORT TERM GOAL #3   Title demonstrate 4+/5 Lt shoulder strength throughout to improve endurance with use    Status Achieved             PT Long Term Goals - 04/05/20 1002      PT LONG TERM GOAL #1   Title be independent in advanced HEP    Time 12    Period Weeks    Status On-going  Plan - 04/05/20 0925    Clinical Impression Statement Pt reports much improvement since the weekend flare up. He does report his balance ( LE, no dizziness) still feels off but has improved throughout the week. Cervical soft tissues improved since last session, softer resting tone but RT upper trap and distal scalene/SCM had inactive/nontender trigger points. Gave tband for HEP since pt logistically cannot get to the gym right now although it is a goal.    Examination-Activity Limitations Sit;Sleep    Examination-Participation Restrictions Community Activity    Stability/Clinical Decision Making Stable/Uncomplicated    Rehab Potential Good    PT Frequency 2x / week    PT Duration 12 weeks    PT Treatment/Interventions ADLs/Self Care Home Management;Cryotherapy;Electrical Stimulation;Traction;Moist Heat;Functional mobility training;Therapeutic activities;Therapeutic exercise;Manual techniques;Patient/family education;Passive range of motion;Dry needling;Spinal Manipulations;Joint Manipulations;Taping    PT Next Visit Plan Review LTGs and see how band exercises are going at home. FOTO for 10th visit.    PT Home Exercise Plan Access Code: [email or web address]    Consulted  and Agree with Plan of Care Patient           Patient will benefit from skilled therapeutic intervention in order to improve the following deficits and impairments:  Decreased activity tolerance,Postural dysfunction,Improper body mechanics,Impaired flexibility,Pain,Increased muscle spasms,Decreased range of motion  Visit Diagnosis: Cervicalgia  Pain in left arm  Muscle weakness (generalized)  Cramp and spasm     Problem List Patient Active Problem List   Diagnosis Date Noted  . Pulmonary emphysema ([place]) 12/09/2018  . Loud snoring 12/09/2018  . OSA and COPD overlap syndrome ([place]) 12/09/2018    Mark Travis, PTA 04/05/2020, 10:04 AM  [facility] [address] [place], [place], [address] Phone: [phone]   Fax:  [phone]  Name: Mark Travis MRN: [id number] Date of Birth: 1950-05-10

## 2020-04-10 ENCOUNTER — Encounter: Payer: Self-pay | Admitting: Physical Therapy

## 2020-04-10 ENCOUNTER — Other Ambulatory Visit: Payer: Self-pay

## 2020-04-10 ENCOUNTER — Ambulatory Visit: Payer: Medicare PPO | Admitting: Physical Therapy

## 2020-04-10 DIAGNOSIS — R252 Cramp and spasm: Secondary | ICD-10-CM

## 2020-04-10 DIAGNOSIS — M542 Cervicalgia: Secondary | ICD-10-CM | POA: Diagnosis not present

## 2020-04-10 DIAGNOSIS — M79602 Pain in left arm: Secondary | ICD-10-CM

## 2020-04-10 DIAGNOSIS — M6281 Muscle weakness (generalized): Secondary | ICD-10-CM

## 2020-04-10 NOTE — Therapy (Addendum)
[facility] [address], [place], [place], [address] Phone: [phone]   Fax:  [phone]  Physical Therapy Treatment  Patient Details  Name: Mark Travis MRN: [id number] Date of Birth: 1951/01/06 Referring Provider (PT): Melina Schools, MD   Encounter Date: 04/10/2020 Progress Note Reporting Period 02/24/20 to 04/10/2020  See note below for Objective Data and Assessment of Progress/Goals.   Sigurd Sos, PT 04/10/20 2:07 PM     PT End of Session - 04/10/20 1053    Visit Number 10    Date for PT Re-Evaluation 05/18/20    Authorization Type Cohere: visits requested    PT Start Time 1053    PT Stop Time 1135    PT Time Calculation (min) 42 min    Activity Tolerance Patient tolerated treatment well    Behavior During Therapy [facility] for tasks assessed/performed           Past Medical History:  Diagnosis Date  . Chronic low back pain   . Decreased hearing   . Diverticulosis   . Emphysema (subcutaneous) (surgical) resulting from a procedure   . Fatty liver   . Glaucoma   . Hiatal hernia   . IBS (irritable bowel syndrome)   . OSA on CPAP   . Prostatitis   . Seasonal allergies     Past Surgical History:  Procedure Laterality Date  . COLONOSCOPY  10/2012  . L trigger finger release      There were no vitals filed for this visit.   Subjective Assessment - 04/10/20 1057    Pertinent History LBP    Limitations Sitting;Standing;Walking              [facility] PT Assessment - 04/10/20 0001      Observation/Other Assessments   Focus on Therapeutic Outcomes (FOTO)  47% limited      Strength   Overall Strength Comments Bil shoulders 5/5                         [facility] Adult PT Treatment/Exercise - 04/10/20 0001      Neck Exercises: Machines for Strengthening   UBE (Upper Arm Bike) L1 3x3 for warm up and discussion of status      Neck Exercises: Standing   Other Standing Exercises  Green/blue rows & extensions 10x2 each      Manual Therapy   Manual Therapy Soft tissue mobilization;Passive ROM;Manual Traction    Soft tissue mobilization to bil UT, levator, cspine and suboccipitals    Passive ROM into lateral flexion; passive stretch of scalenes, levator UT                    PT Short Term Goals - 03/29/20 1115      PT SHORT TERM GOAL #1   Title be independent in initial HEP    Status Achieved      PT SHORT TERM GOAL #2   Title report 30% reduction in the frequency and intensity of neck pain and Lt UE pain    Baseline able to do push up the other day; first time in months    Status Achieved      PT SHORT TERM GOAL #3   Title demonstrate 4+/5 Lt shoulder strength throughout to improve endurance with use    Status Achieved             PT Long Term Goals - 04/10/20 1059  PT LONG TERM GOAL #1   Title be independent in advanced HEP    Time 12    Period Weeks    Status On-going      PT LONG TERM GOAL #2   Title reduce FOTO to < or = to 37% limitation    Time 12    Period Weeks    Status On-going   47%     PT LONG TERM GOAL #3   Title report a 70% reduction in the frequency and intensity of neck pain and Lt UE radiculopathy    Time 12    Period Weeks    Status On-going   frequency about hte same: intensity 50%-60% less     PT LONG TERM GOAL #4   Title tolerate sitting for 1.5 hours with mini break without increased neck or Lt UE pain    Time 12    Period Weeks    Status Partially Met   I just move more and this is a better strategy.     PT LONG TERM GOAL #5   Title demonstarte 5/5 Lt shoulder strength to improve endurance with use    Time 12    Period Weeks    Status Achieved      PT LONG TERM GOAL #6   Title return to regular exercise routine at the [facility] without limitation or increased pain    Time 12    Period Weeks    Status On-going   Wants to but has a lot going on with wife and MD appts                Plan -  04/10/20 1053    Clinical Impression Statement Overall pt reports 50%-60% improvement since initial eval. His pain frequency is "about the same" but intensity is down 50%-60% progressing towards long term goal. He has not returned to [facility] due to his wife having a lot of medical appointments and health issues right now ( he is her driver ). Bil shoulder strength 5/5 today and FOTO score slightly reduced.    Examination-Activity Limitations Sit;Sleep    Examination-Participation Restrictions Community Activity    Rehab Potential Good    PT Frequency 2x / week    PT Duration 12 weeks    PT Treatment/Interventions ADLs/Self Care Home Management;Cryotherapy;Electrical Stimulation;Traction;Moist Heat;Functional mobility training;Therapeutic activities;Therapeutic exercise;Manual techniques;Patient/family education;Passive range of motion;Dry needling;Spinal Manipulations;Joint Manipulations;Taping    PT Home Exercise Plan Access Code: [email or web address]    Consulted and Agree with Plan of Care Patient           Patient will benefit from skilled therapeutic intervention in order to improve the following deficits and impairments:  Decreased activity tolerance,Postural dysfunction,Improper body mechanics,Impaired flexibility,Pain,Increased muscle spasms,Decreased range of motion  Visit Diagnosis: Cervicalgia  Pain in left arm  Muscle weakness (generalized)  Cramp and spasm     Problem List Patient Active Problem List   Diagnosis Date Noted  . Pulmonary emphysema ([place]) 12/09/2018  . Loud snoring 12/09/2018  . OSA and COPD overlap syndrome ([place]) 12/09/2018    Myrene Galas, PTA 04/10/20 11:42 AM  04/10/2020, 11:42 AM  [facility] [address], [place], [place], [address] Phone: [phone]   Fax:  [phone]  Name: Kei Mcelhiney MRN: [id number] Date of Birth: [birth year]

## 2020-04-12 ENCOUNTER — Other Ambulatory Visit: Payer: Self-pay

## 2020-04-12 ENCOUNTER — Encounter: Payer: Self-pay | Admitting: Physical Therapy

## 2020-04-12 ENCOUNTER — Ambulatory Visit: Payer: Medicare PPO | Admitting: Physical Therapy

## 2020-04-12 DIAGNOSIS — M542 Cervicalgia: Secondary | ICD-10-CM

## 2020-04-12 DIAGNOSIS — M6281 Muscle weakness (generalized): Secondary | ICD-10-CM

## 2020-04-12 DIAGNOSIS — M79602 Pain in left arm: Secondary | ICD-10-CM

## 2020-04-12 DIAGNOSIS — R252 Cramp and spasm: Secondary | ICD-10-CM

## 2020-04-12 NOTE — Therapy (Signed)
[facility] [address], [place], [place], [address] Phone: [phone]   Fax:  [phone]  Physical Therapy Treatment  Patient Details  Name: Mark Travis MRN: [id number] Date of Birth: [birth year] Referring Provider (PT): Melina Schools, MD   Encounter Date: 04/12/2020   PT End of Session - 04/12/20 1055    Visit Number 11    Date for PT Re-Evaluation 05/18/20    Authorization Type Cohere: visits requested    PT Start Time 1054    PT Stop Time 1132    PT Time Calculation (min) 38 min    Activity Tolerance Patient tolerated treatment well    Behavior During Therapy [facility] for tasks assessed/performed           Past Medical History:  Diagnosis Date  . Chronic low back pain   . Decreased hearing   . Diverticulosis   . Emphysema (subcutaneous) (surgical) resulting from a procedure   . Fatty liver   . Glaucoma   . Hiatal hernia   . IBS (irritable bowel syndrome)   . OSA on CPAP   . Prostatitis   . Seasonal allergies     Past Surgical History:  Procedure Laterality Date  . COLONOSCOPY  10/2012  . L trigger finger release      There were no vitals filed for this visit.   Subjective Assessment - 04/12/20 1057    Subjective Have not slept well the last few evenings, not sure why. Pelvis was achey, reports his LE still feel unstable since his dry needling.    Pertinent History LBP    Diagnostic tests MRI: C4,5 and 7- DDD with spurring.    Currently in Pain? Yes   Neck is a little sore today   Pain Score 2     Pain Location Neck    Pain Orientation Right;Left    Pain Descriptors / Indicators Sore    Aggravating Factors  Not moving enough    Pain Relieving Factors heat, advil, massage    Multiple Pain Sites No                             [facility] Adult PT Treatment/Exercise - 04/12/20 0001      Neck Exercises: Machines for Strengthening   UBE (Upper Arm Bike) L1 3x3 for warm up and  discussion of status    Cybex Row 25# 2x10    Lat Pull 20# 2x10    Other Machines for Strengthening 2# shoulder 3 way raise    Other Machines for Strengthening bicep curls 8# 10x      Neck Exercises: Standing   Other Standing Exercises head press/chin tuck with towel on wall: 3 sec hold 10x: added to home    Other Standing Exercises wall pushup 10x                  PT Education - 04/12/20 1119    Education Details HEP    Person(s) Educated Patient    Methods Explanation;Demonstration;Verbal cues;Handout    Comprehension Returned demonstration;Verbalized understanding            PT Short Term Goals - 03/29/20 1115      PT SHORT TERM GOAL #1   Title be independent in initial HEP    Status Achieved      PT SHORT TERM GOAL #2   Title report 30% reduction in the frequency and intensity of neck pain  and Lt UE pain    Baseline able to do push up the other day; first time in months    Status Achieved      PT SHORT TERM GOAL #3   Title demonstrate 4+/5 Lt shoulder strength throughout to improve endurance with use    Status Achieved             PT Long Term Goals - 04/10/20 1059      PT LONG TERM GOAL #1   Title be independent in advanced HEP    Time 12    Period Weeks    Status On-going      PT LONG TERM GOAL #2   Title reduce FOTO to < or = to 37% limitation    Time 12    Period Weeks    Status On-going   47%     PT LONG TERM GOAL #3   Title report a 70% reduction in the frequency and intensity of neck pain and Lt UE radiculopathy    Time 12    Period Weeks    Status On-going   frequency about hte same: intensity 50%-60% less     PT LONG TERM GOAL #4   Title tolerate sitting for 1.5 hours with mini break without increased neck or Lt UE pain    Time 12    Period Weeks    Status Partially Met   I just move more and this is a better strategy.     PT LONG TERM GOAL #5   Title demonstarte 5/5 Lt shoulder strength to improve endurance with use    Time  12    Period Weeks    Status Achieved      PT LONG TERM GOAL #6   Title return to regular exercise routine at the [facility] without limitation or increased pain    Time 12    Period Weeks    Status On-going   Wants to but has a lot going on with wife and MD appts                Plan - 04/12/20 1055    Clinical Impression Statement Pt able to resume gym equipment and free weight lifting today. He had no increased pain with any of the exercises and also reported his LT grip is feeling stronger lately. Pt does require the occasional verbal cue for posture especially for cervical retraction on the wall. Pt likes having a combination of home exercises and gym exercises in case he cannot get there. Pt does report an ongoing feeling of instability in his LE. He understands letting his MD know this will be important and plans to do so at some time.    Examination-Activity Limitations Sit;Sleep    Examination-Participation Restrictions Community Activity    Stability/Clinical Decision Making Stable/Uncomplicated    Rehab Potential Good    PT Frequency 2x / week    PT Duration 12 weeks    PT Treatment/Interventions ADLs/Self Care Home Management;Cryotherapy;Electrical Stimulation;Traction;Moist Heat;Functional mobility training;Therapeutic activities;Therapeutic exercise;Manual techniques;Patient/family education;Passive range of motion;Dry needling;Spinal Manipulations;Joint Manipulations;Taping    PT Next Visit Plan Review wall cervical retraction with small ball on the wall, continue with postural strength and endurance exercises.    PT Home Exercise Plan Access Code: [email or web address]    Consulted and Agree with Plan of Care Patient           Patient will benefit from skilled therapeutic intervention in order to improve the following deficits and impairments:  Decreased activity tolerance,Postural dysfunction,Improper body mechanics,Impaired flexibility,Pain,Increased muscle spasms,Decreased range of  motion  Visit Diagnosis: Cervicalgia  Pain in left arm  Muscle weakness (generalized)  Cramp and spasm     Problem List Patient Active Problem List   Diagnosis Date Noted  . Pulmonary emphysema ([place]) 12/09/2018  . Loud snoring 12/09/2018  . OSA and COPD overlap syndrome ([place]) 12/09/2018    Addie Cederberg, PTA 04/12/2020, 11:43 AM  [facility] [address], [place], [place], [address] Phone: [phone]   Fax:  [phone]  Name: Roey Coopman MRN: [id number] Date of Birth: Dec 06, 1950  Access Code: [email or web address]: [email or web address]: 12/29/2021Prepared by: Anderson Malta CochranExercises  Seated Cervical Flexion AROM - 3 x daily - 7 x weekly - 3 reps - 1 sets - 20 hold  Seated Cervical Sidebending AROM - 3 x daily - 7 x weekly - 1 sets - 3 reps - 20 hold  Seated Cervical Rotation AROM - 3 x daily - 7 x weekly - 3 reps - 1 sets - 20 hold  Seated Correct Posture - 1 x daily - 7 x weekly - 10 reps - 3 sets  Seated Cervical Retraction - 3 x daily - 7 x weekly - 10 reps - 1 sets - 3-5 sec hold  Standing Row with Anchored Resistance - 1 x daily - 7 x weekly - 3 sets - 10 reps  Single Arm Shoulder Extension with Anchored Resistance - 1 x daily - 7 x weekly - 3 sets - 10 reps  Standing Isometric Cervical Retraction with Chin Tucks and Ball at [employer] - 1 x daily - 7 x weekly - 1 sets - 10 reps - 5 hold Patient Education  Trigger Point Dry Needling

## 2020-04-17 ENCOUNTER — Encounter: Payer: Self-pay | Admitting: Physical Therapy

## 2020-04-17 ENCOUNTER — Other Ambulatory Visit: Payer: Self-pay

## 2020-04-17 ENCOUNTER — Ambulatory Visit: Payer: Medicare HMO | Attending: Surgery | Admitting: Physical Therapy

## 2020-04-17 DIAGNOSIS — M79602 Pain in left arm: Secondary | ICD-10-CM | POA: Insufficient documentation

## 2020-04-17 DIAGNOSIS — M542 Cervicalgia: Secondary | ICD-10-CM

## 2020-04-17 DIAGNOSIS — M6281 Muscle weakness (generalized): Secondary | ICD-10-CM | POA: Diagnosis present

## 2020-04-17 DIAGNOSIS — R252 Cramp and spasm: Secondary | ICD-10-CM | POA: Diagnosis present

## 2020-04-17 NOTE — Therapy (Signed)
[facility] [address] [place], [place], [address] Phone: [phone]   Fax:  [phone]  Physical Therapy Treatment  Patient Details  Name: Mark Travis MRN: [id number] Date of Birth: 09-02-1950 Referring Provider (PT): Melina Schools, MD   Encounter Date: 04/17/2020   PT End of Session - 04/17/20 0950    Visit Number 12    Date for PT Re-Evaluation 05/18/20    Authorization Type Cohere: visits requested    PT Start Time 0915    PT Stop Time 1005    PT Time Calculation (min) 50 min    Activity Tolerance Patient tolerated treatment well    Behavior During Therapy [facility] for tasks assessed/performed           Past Medical History:  Diagnosis Date  . Chronic low back pain   . Decreased hearing   . Diverticulosis   . Emphysema (subcutaneous) (surgical) resulting from a procedure   . Fatty liver   . Glaucoma   . Hiatal hernia   . IBS (irritable bowel syndrome)   . OSA on CPAP   . Prostatitis   . Seasonal allergies     Past Surgical History:  Procedure Laterality Date  . COLONOSCOPY  10/2012  . L trigger finger release      There were no vitals filed for this visit.   Subjective Assessment - 04/17/20 0918    Subjective I got my COVID booster yesterday so my left arm is sore. My neck is bothering me more due to the increased in humidity.    Pertinent History LBP    Limitations Sitting;Standing;Walking    How long can you sit comfortably? 1 hour max-neck pain    Diagnostic tests MRI: C4,5 and 7- DDD with spurring.    Patient Stated Goals reduce Lt UE pain, reduce neck pain    Currently in Pain? Yes    Pain Score 4     Pain Location Neck   headache above the eyebrows   Pain Orientation Right;Left    Pain Descriptors / Indicators Sore    Pain Type Chronic pain    Pain Onset More than a month ago    Pain Frequency Intermittent    Aggravating Factors  Not moving enough    Pain Relieving Factors heat,  advil, massage    Multiple Pain Sites No                             [facility] Adult PT Treatment/Exercise - 04/17/20 0001      Neck Exercises: Machines for Strengthening   UBE (Upper Arm Bike) L1 3x3 for warm up and discussion of status    Other Machines for Strengthening Not do  because of sore arm from COVID    Other Machines for Strengthening bicep curls 8# 10x   sat in chair with back rest     Modalities   Modalities Traction      Traction   Type of Traction Cervical    Min (lbs) 5    Max (lbs) 17    Time 15      Manual Therapy   Manual Therapy Soft tissue mobilization    Soft tissue mobilization Scalenes, SCM, suboccipitals, cervical paraspinals, and along the mandible                    PT Short Term Goals - 03/29/20 1115  PT SHORT TERM GOAL #1   Title be independent in initial HEP    Status Achieved      PT SHORT TERM GOAL #2   Title report 30% reduction in the frequency and intensity of neck pain and Lt UE pain    Baseline able to do push up the other day; first time in months    Status Achieved      PT SHORT TERM GOAL #3   Title demonstrate 4+/5 Lt shoulder strength throughout to improve endurance with use    Status Achieved             PT Long Term Goals - 04/10/20 1059      PT LONG TERM GOAL #1   Title be independent in advanced HEP    Time 12    Period Weeks    Status On-going      PT LONG TERM GOAL #2   Title reduce FOTO to < or = to 37% limitation    Time 12    Period Weeks    Status On-going   47%     PT LONG TERM GOAL #3   Title report a 70% reduction in the frequency and intensity of neck pain and Lt UE radiculopathy    Time 12    Period Weeks    Status On-going   frequency about hte same: intensity 50%-60% less     PT LONG TERM GOAL #4   Title tolerate sitting for 1.5 hours with mini break without increased neck or Lt UE pain    Time 12    Period Weeks    Status Partially Met   I just move more and  this is a better strategy.     PT LONG TERM GOAL #5   Title demonstarte 5/5 Lt shoulder strength to improve endurance with use    Time 12    Period Weeks    Status Achieved      PT LONG TERM GOAL #6   Title return to regular exercise routine at the [facility] without limitation or increased pain    Time 12    Period Weeks    Status On-going   Wants to but has a lot going on with wife and MD appts                Plan - 04/17/20 0951    Clinical Impression Statement Patient had the COVID vaccine yesterday and increased neck pain from the weather so therapy was more for pain relief. Patient had some tightness in the cervical muscuature. His headache resolved after the manual work. Patient was not able to do the shoulder exercises due to soreness in the left arm from the COVID shot. Patient has not met goas today due to the increase in pain. Patient tried traction today due to his home traction helps. Patient will benefit from skilled therapy to improve function while reducing pain.    Examination-Activity Limitations Sit;Sleep    Examination-Participation Restrictions Community Activity    Stability/Clinical Decision Making Stable/Uncomplicated    Rehab Potential Good    PT Frequency 2x / week    PT Duration 12 weeks    PT Treatment/Interventions ADLs/Self Care Home Management;Cryotherapy;Electrical Stimulation;Traction;Moist Heat;Functional mobility training;Therapeutic activities;Therapeutic exercise;Manual techniques;Patient/family education;Passive range of motion;Dry needling;Spinal Manipulations;Joint Manipulations;Taping    PT Next Visit Plan Review wall cervical retraction with small ball on the wall, continue with postural strength and endurance exercises. Assess traction    PT Home Exercise Plan  Access Code: [email or web address]    Recommended Other Services MD signed initial eval    Consulted and Agree with Plan of Care Patient           Patient will benefit from skilled therapeutic  intervention in order to improve the following deficits and impairments:  Decreased activity tolerance,Postural dysfunction,Improper body mechanics,Impaired flexibility,Pain,Increased muscle spasms,Decreased range of motion  Visit Diagnosis: Cervicalgia  Pain in left arm  Muscle weakness (generalized)  Cramp and spasm     Problem List Patient Active Problem List   Diagnosis Date Noted  . Pulmonary emphysema ([employer]) 12/09/2018  . Loud snoring 12/09/2018  . OSA and COPD overlap syndrome ([place]) 12/09/2018    Earlie Counts, PT 04/17/20 9:54 AM   [facility] [address] [place], [place], [address] Phone: [phone]   Fax:  [phone]  Name: Mark Travis MRN: [id number] Date of Birth: 1950/08/26

## 2020-04-19 ENCOUNTER — Other Ambulatory Visit: Payer: Self-pay

## 2020-04-19 ENCOUNTER — Ambulatory Visit: Payer: Medicare HMO

## 2020-04-19 DIAGNOSIS — M6281 Muscle weakness (generalized): Secondary | ICD-10-CM

## 2020-04-19 DIAGNOSIS — M542 Cervicalgia: Secondary | ICD-10-CM

## 2020-04-19 DIAGNOSIS — M79602 Pain in left arm: Secondary | ICD-10-CM

## 2020-04-19 DIAGNOSIS — R252 Cramp and spasm: Secondary | ICD-10-CM

## 2020-04-19 NOTE — Therapy (Signed)
[facility] [address] [place], [place], [address] Phone: [phone]   Fax:  [phone]  Physical Therapy Treatment  Patient Details  Name: Mark Travis MRN: [id number] Date of Birth: August 05, 1950 Referring Provider (PT): Venita Lick, MD   Encounter Date: 04/19/2020   PT End of Session - 04/19/20 1009    Visit Number 13    Date for PT Re-Evaluation 05/18/20    Authorization Type Cohere: 8 visits 1/1-2/3    Authorization - Visit Number 2    Authorization - Number of Visits 8    Progress Note Due on Visit 20    PT Start Time 0930    PT Stop Time 1018    PT Time Calculation (min) 48 min    Activity Tolerance Patient tolerated treatment well    Behavior During Therapy [facility] for tasks assessed/performed           Past Medical History:  Diagnosis Date  . Chronic low back pain   . Decreased hearing   . Diverticulosis   . Emphysema (subcutaneous) (surgical) resulting from a procedure   . Fatty liver   . Glaucoma   . Hiatal hernia   . IBS (irritable bowel syndrome)   . OSA on CPAP   . Prostatitis   . Seasonal allergies     Past Surgical History:  Procedure Laterality Date  . COLONOSCOPY  10/2012  . L trigger finger release      There were no vitals filed for this visit.   Subjective Assessment - 04/19/20 0930    Subjective My arm is feeling better.                             [facility] Adult PT Treatment/Exercise - 04/19/20 0001      Exercises   Exercises Shoulder      Neck Exercises: Machines for Strengthening   UBE (Upper Arm Bike) L1 2/2  for warm up and discussion of status      Shoulder Exercises: Seated   Horizontal ABduction Strengthening;Both;20 reps;Theraband    Theraband Level (Shoulder Horizontal ABduction) Level 2 (Red)    External Rotation Strengthening;Both;20 reps;Theraband    Theraband Level (Shoulder External Rotation) Level 2 (Red)    Diagonals  Strengthening;Both;20 reps;Theraband    Theraband Level (Shoulder Diagonals) Level 2 (Red)      Modalities   Modalities Traction      Traction   Type of Traction Cervical    Min (lbs) 5    Max (lbs) 17    Hold Time 60    Rest Time 10    Time 12      Manual Therapy   Manual Therapy Soft tissue mobilization    Soft tissue mobilization Scalenes, SCM, suboccipitals, cervical paraspinals                    PT Short Term Goals - 03/29/20 1115      PT SHORT TERM GOAL #1   Title be independent in initial HEP    Status Achieved      PT SHORT TERM GOAL #2   Title report 30% reduction in the frequency and intensity of neck pain and Lt UE pain    Baseline able to do push up the other day; first time in months    Status Achieved      PT SHORT TERM GOAL #3   Title demonstrate 4+/5 Lt shoulder  strength throughout to improve endurance with use    Status Achieved             PT Long Term Goals - 04/19/20 [id number]      PT LONG TERM GOAL #1   Title be independent in advanced HEP    Time 12    Period Weeks    Status On-going      PT LONG TERM GOAL #3   Title report a 70% reduction in the frequency and intensity of neck pain and Lt UE radiculopathy    Baseline No Lt arm pain, neck pain 50% better    Time 12    Period Weeks    Status On-going      PT LONG TERM GOAL #4   Title tolerate sitting for 1.5 hours with mini break without increased neck or Lt UE pain    Baseline taking mini breaks      PT LONG TERM GOAL #6   Title return to regular exercise routine at the [facility] without limitation or increased pain    Baseline hasnt tried this yet    Time 12    Period Weeks    Status On-going                 Plan - 04/19/20 0946    Clinical Impression Statement Pt reports 50% overall improvement in neck pain since the start of care and Lt UE pain has resolved. Pt is taking mini breaks with long periods of sitting and is able to tolerate longer sitting.  Pt has not  returned to the gym but has been doing arm weights at home now. Patient had some tightness in the cervical musculature with manual therapy. Pt responded well to traction last session and this was repeated again today.  Pt did well with theraband unattached today with minor cueing for technique. Patient will benefit from skilled therapy to improve function while reducing pain.    PT Frequency 2x / week    PT Duration 12 weeks    PT Treatment/Interventions ADLs/Self Care Home Management;Cryotherapy;Electrical Stimulation;Traction;Moist Heat;Functional mobility training;Therapeutic activities;Therapeutic exercise;Manual techniques;Patient/family education;Passive range of motion;Dry needling;Spinal Manipulations;Joint Manipulations;Taping    PT Next Visit Plan continue postural strength for thoracic and cervical, manual as needed, traction    PT Home Exercise Plan Access Code: [email or web address]    Consulted and Agree with Plan of Care Patient           Patient will benefit from skilled therapeutic intervention in order to improve the following deficits and impairments:  Decreased activity tolerance,Postural dysfunction,Improper body mechanics,Impaired flexibility,Pain,Increased muscle spasms,Decreased range of motion  Visit Diagnosis: Cervicalgia  Pain in left arm  Muscle weakness (generalized)  Cramp and spasm     Problem List Patient Active Problem List   Diagnosis Date Noted  . Pulmonary emphysema ([place]) 12/09/2018  . Loud snoring 12/09/2018  . OSA and COPD overlap syndrome ([place]) 12/09/2018     Mark Travis, PT 04/19/20 10:12 AM  [facility] [address] [place], [place], [address] Phone: [phone]   Fax:  [phone]  Name: Mark Travis MRN: [id number] Date of Birth: 1950/07/23

## 2020-04-26 ENCOUNTER — Other Ambulatory Visit: Payer: Self-pay

## 2020-04-26 ENCOUNTER — Ambulatory Visit: Payer: Medicare HMO | Admitting: Physical Therapy

## 2020-04-26 ENCOUNTER — Encounter: Payer: Self-pay | Admitting: Physical Therapy

## 2020-04-26 DIAGNOSIS — M79602 Pain in left arm: Secondary | ICD-10-CM

## 2020-04-26 DIAGNOSIS — M6281 Muscle weakness (generalized): Secondary | ICD-10-CM

## 2020-04-26 DIAGNOSIS — R252 Cramp and spasm: Secondary | ICD-10-CM

## 2020-04-26 DIAGNOSIS — M542 Cervicalgia: Secondary | ICD-10-CM | POA: Diagnosis not present

## 2020-04-26 NOTE — Therapy (Signed)
[facility] [address] [place], [place], [address] Phone: [phone]   Fax:  [phone]  Physical Therapy Treatment  Patient Details  Name: Mark Travis MRN: [id number] Date of Birth: 04/23/1950 Referring Provider (PT): Melina Schools, MD   Encounter Date: 04/26/2020   PT End of Session - 04/26/20 1149    Visit Number 14    Date for PT Re-Evaluation 05/18/20    Authorization Type Cohere: 8 visits 1/1-2/3    Authorization - Visit Number 3    Authorization - Number of Visits 8    PT Start Time 4627    PT Stop Time 1230    PT Time Calculation (min) 45 min    Activity Tolerance Patient tolerated treatment well    Behavior During Therapy [facility] for tasks assessed/performed           Past Medical History:  Diagnosis Date  . Chronic low back pain   . Decreased hearing   . Diverticulosis   . Emphysema (subcutaneous) (surgical) resulting from a procedure   . Fatty liver   . Glaucoma   . Hiatal hernia   . IBS (irritable bowel syndrome)   . OSA on CPAP   . Prostatitis   . Seasonal allergies     Past Surgical History:  Procedure Laterality Date  . COLONOSCOPY  10/2012  . L trigger finger release      There were no vitals filed for this visit.   Subjective Assessment - 04/26/20 1216    Subjective The neck is much better and feels like the traction definitely helps.  my Rt knee is the only thing bothering me today, it was very sore yesterday. Dennies pain currently.    Currently in Pain? No/denies                             [facility] Adult PT Treatment/Exercise - 04/26/20 0001      Neck Exercises: Machines for Strengthening   UBE (Upper Arm Bike) L1 2/2  for warm up and discussion of status      Shoulder Exercises: Seated   Horizontal ABduction Strengthening    Theraband Level (Shoulder Horizontal ABduction) Level 3 (Green)    External Rotation Strengthening;Both;20 reps;Theraband     Theraband Level (Shoulder External Rotation) Level 3 (Green)    Diagonals Strengthening;Both;20 reps;Theraband    Theraband Level (Shoulder Diagonals) Level 3 (Green)      Traction   Type of Traction Cervical    Min (lbs) 5    Max (lbs) 17    Hold Time 60    Rest Time 10    Time 15      Manual Therapy   Soft tissue mobilization Scalenes, SCM, suboccipitals, cervical paraspinals                    PT Short Term Goals - 03/29/20 1115      PT SHORT TERM GOAL #1   Title be independent in initial HEP    Status Achieved      PT SHORT TERM GOAL #2   Title report 30% reduction in the frequency and intensity of neck pain and Lt UE pain    Baseline able to do push up the other day; first time in months    Status Achieved      PT SHORT TERM GOAL #3   Title demonstrate 4+/5 Lt shoulder strength throughout to improve endurance  with use    Status Achieved             PT Long Term Goals - 04/26/20 1223      PT LONG TERM GOAL #1   Title be independent in advanced HEP    Status On-going      PT LONG TERM GOAL #3   Title report a 70% reduction in the frequency and intensity of neck pain and Lt UE radiculopathy    Baseline No Lt arm pain, neck pain 50% better    Status On-going      PT LONG TERM GOAL #4   Title tolerate sitting for 1.5 hours with mini break without increased neck or Lt UE pain    Status Partially Met      PT LONG TERM GOAL #5   Title demonstarte 5/5 Lt shoulder strength to improve endurance with use    Status On-going                 Plan - 04/26/20 1220    Clinical Impression Statement Pt continues to report improvements.  Pt continues to progress with band resistance ex's.  He had some muscle tension that continues to respond from [facility] and traction helping to alleviate nerve pain. Pt will benefit from skilled PT to continue to work towards functional goals.    PT Treatment/Interventions ADLs/Self Care Home Management;Cryotherapy;Electrical  Stimulation;Traction;Moist Heat;Functional mobility training;Therapeutic activities;Therapeutic exercise;Manual techniques;Patient/family education;Passive range of motion;Dry needling;Spinal Manipulations;Joint Manipulations;Taping    PT Next Visit Plan continue postural strength for thoracic and cervical, manual as needed, traction    PT Home Exercise Plan Access Code: [email or web address]    Consulted and Agree with Plan of Care Patient           Patient will benefit from skilled therapeutic intervention in order to improve the following deficits and impairments:  Decreased activity tolerance,Postural dysfunction,Improper body mechanics,Impaired flexibility,Pain,Increased muscle spasms,Decreased range of motion  Visit Diagnosis: Cervicalgia  Pain in left arm  Muscle weakness (generalized)  Cramp and spasm     Problem List Patient Active Problem List   Diagnosis Date Noted  . Pulmonary emphysema ([place]) 12/09/2018  . Loud snoring 12/09/2018  . OSA and COPD overlap syndrome ([place]) 12/09/2018    Jule Ser, PT 04/26/2020, 12:24 PM  [facility] [address], [place], [place], [address] Phone: [phone]   Fax:  [phone]  Name: Mark Travis MRN: [id number] Date of Birth: 09/14/1950

## 2020-04-27 DIAGNOSIS — M25561 Pain in right knee: Secondary | ICD-10-CM | POA: Insufficient documentation

## 2020-04-28 ENCOUNTER — Ambulatory Visit: Payer: Medicare HMO | Admitting: Physical Therapy

## 2020-04-28 ENCOUNTER — Other Ambulatory Visit: Payer: Self-pay

## 2020-04-28 ENCOUNTER — Encounter: Payer: Self-pay | Admitting: Physical Therapy

## 2020-04-28 DIAGNOSIS — M542 Cervicalgia: Secondary | ICD-10-CM | POA: Diagnosis not present

## 2020-04-28 DIAGNOSIS — M79602 Pain in left arm: Secondary | ICD-10-CM

## 2020-04-28 DIAGNOSIS — R252 Cramp and spasm: Secondary | ICD-10-CM

## 2020-04-28 DIAGNOSIS — M6281 Muscle weakness (generalized): Secondary | ICD-10-CM

## 2020-04-28 NOTE — Therapy (Signed)
[facility] [address], [place], [place], [address] Phone: [phone]   Fax:  [phone]  Physical Therapy Treatment  Patient Details  Name: Mark Travis MRN: [id number] Date of Birth: 07-13-1950 Referring Provider (PT): Melina Schools, MD   Encounter Date: 04/28/2020   PT End of Session - 04/28/20 0915    Visit Number 15    Date for PT Re-Evaluation 05/18/20    Authorization Type Cohere: 8 visits 1/1-2/3    Authorization - Visit Number 4    Authorization - Number of Visits 8    Progress Note Due on Visit 20    PT Start Time 0913    PT Stop Time 0951    PT Time Calculation (min) 38 min    Activity Tolerance Patient tolerated treatment well    Behavior During Therapy [facility] for tasks assessed/performed           Past Medical History:  Diagnosis Date  . Chronic low back pain   . Decreased hearing   . Diverticulosis   . Emphysema (subcutaneous) (surgical) resulting from a procedure   . Fatty liver   . Glaucoma   . Hiatal hernia   . IBS (irritable bowel syndrome)   . OSA on CPAP   . Prostatitis   . Seasonal allergies     Past Surgical History:  Procedure Laterality Date  . COLONOSCOPY  10/2012  . L trigger finger release      There were no vitals filed for this visit.   Subjective Assessment - 04/28/20 0917    Subjective Woke up this AM with some OA "pounding." This went away after some stretching. Seeing MD for knee pain today.    Diagnostic tests MRI: C4,5 and 7- DDD with spurring.    Currently in Pain? No/denies    Multiple Pain Sites No                             [facility] Adult PT Treatment/Exercise - 04/28/20 0001      Neck Exercises: Machines for Strengthening   UBE (Upper Arm Bike) L2 3x3 with dicusiion of current status      Shoulder Exercises: Seated   Horizontal ABduction Strengthening;Both;Theraband    Theraband Level (Shoulder Horizontal ABduction) Level 4 (Blue)    2x10   External Rotation Strengthening;Both;Theraband    Theraband Level (Shoulder External Rotation) Level 4 (Blue)   2x10   Diagonals Strengthening;Theraband    Theraband Level (Shoulder Diagonals) Level 4 (Blue)      Traction   Type of Traction Cervical    Min (lbs) 5    Max (lbs) 17    Hold Time 60    Rest Time 10    Time 15                    PT Short Term Goals - 03/29/20 1115      PT SHORT TERM GOAL #1   Title be independent in initial HEP    Status Achieved      PT SHORT TERM GOAL #2   Title report 30% reduction in the frequency and intensity of neck pain and Lt UE pain    Baseline able to do push up the other day; first time in months    Status Achieved      PT SHORT TERM GOAL #3   Title demonstrate 4+/5 Lt shoulder strength throughout to improve endurance with  use    Status Achieved             PT Long Term Goals - 04/26/20 1223      PT LONG TERM GOAL #1   Title be independent in advanced HEP    Status On-going      PT LONG TERM GOAL #3   Title report a 70% reduction in the frequency and intensity of neck pain and Lt UE radiculopathy    Baseline No Lt arm pain, neck pain 50% better    Status On-going      PT LONG TERM GOAL #4   Title tolerate sitting for 1.5 hours with mini break without increased neck or Lt UE pain    Status Partially Met      PT LONG TERM GOAL #5   Title demonstarte 5/5 Lt shoulder strength to improve endurance with use    Status On-going                 Plan - 04/28/20 0938    Clinical Impression Statement Pt arrives pain free. Session slightly shortened as pt had another appt at [facility] to evaluate knee pain. Pt preferred keeping the traction pull the same as last time. He feels the traction is very beneficial. Blue theraband was used for postural strengthening exercises today. Pt reports it was "tough" and did have a little increased tightness in the neck.    Examination-Activity Limitations Sit;Sleep     Examination-Participation Restrictions Community Activity    Stability/Clinical Decision Making Stable/Uncomplicated    Rehab Potential Good    PT Frequency 2x / week    PT Duration 12 weeks    PT Treatment/Interventions ADLs/Self Care Home Management;Cryotherapy;Electrical Stimulation;Traction;Moist Heat;Functional mobility training;Therapeutic activities;Therapeutic exercise;Manual techniques;Patient/family education;Passive range of motion;Dry needling;Spinal Manipulations;Joint Manipulations;Taping    PT Next Visit Plan continue postural strength for thoracic and cervical, manual as needed, traction    PT Home Exercise Plan Access Code: [email or web address]    Consulted and Agree with Plan of Care Patient           Patient will benefit from skilled therapeutic intervention in order to improve the following deficits and impairments:  Decreased activity tolerance,Postural dysfunction,Improper body mechanics,Impaired flexibility,Pain,Increased muscle spasms,Decreased range of motion  Visit Diagnosis: Cervicalgia  Pain in left arm  Muscle weakness (generalized)  Cramp and spasm     Problem List Patient Active Problem List   Diagnosis Date Noted  . Pulmonary emphysema ([place]) 12/09/2018  . Loud snoring 12/09/2018  . OSA and COPD overlap syndrome ([place]) 12/09/2018    Taralee Marcus,PTA 04/28/2020, 9:43 AM  [facility] [address] [place], [place], [address] Phone: [phone]   Fax:  [phone]  Name: Mark Travis MRN: [id number] Date of Birth: 09-15-1950

## 2020-05-03 ENCOUNTER — Ambulatory Visit: Payer: Medicare HMO | Admitting: Physical Therapy

## 2020-05-03 ENCOUNTER — Other Ambulatory Visit: Payer: Self-pay

## 2020-05-03 ENCOUNTER — Encounter: Payer: Self-pay | Admitting: Physical Therapy

## 2020-05-03 DIAGNOSIS — M542 Cervicalgia: Secondary | ICD-10-CM | POA: Diagnosis not present

## 2020-05-03 DIAGNOSIS — R252 Cramp and spasm: Secondary | ICD-10-CM

## 2020-05-03 DIAGNOSIS — M6281 Muscle weakness (generalized): Secondary | ICD-10-CM

## 2020-05-03 DIAGNOSIS — M79602 Pain in left arm: Secondary | ICD-10-CM

## 2020-05-03 NOTE — Therapy (Signed)
[facility] [address] [place], [place], [address] Phone: [phone]   Fax:  [phone]  Physical Therapy Treatment  Patient Details  Name: Mark Travis MRN: [id number] Date of Birth: March 21, 1951 Referring Provider (PT): Melina Schools, MD   Encounter Date: 05/03/2020   PT End of Session - 05/03/20 0839    Visit Number 16    Date for PT Re-Evaluation 05/18/20    Authorization Type Cohere: 8 visits 1/1-2/3    Authorization - Visit Number 5    Authorization - Number of Visits 8    Progress Note Due on Visit 20    PT Start Time 0839    PT Stop Time 0917    PT Time Calculation (min) 38 min    Activity Tolerance Patient tolerated treatment well    Behavior During Therapy [facility] for tasks assessed/performed           Past Medical History:  Diagnosis Date  . Chronic low back pain   . Decreased hearing   . Diverticulosis   . Emphysema (subcutaneous) (surgical) resulting from a procedure   . Fatty liver   . Glaucoma   . Hiatal hernia   . IBS (irritable bowel syndrome)   . OSA on CPAP   . Prostatitis   . Seasonal allergies     Past Surgical History:  Procedure Laterality Date  . COLONOSCOPY  10/2012  . L trigger finger release      There were no vitals filed for this visit.   Subjective Assessment - 05/03/20 0840    Subjective Saw Md about his knee pain. MD thinks arthritis and pt will get a cortisone shot next week. Shoulder did well with shoveling the last day.    Diagnostic tests MRI: C4,5 and 7- DDD with spurring.    Currently in Pain? No/denies                             [facility] Adult PT Treatment/Exercise - 05/03/20 0001      Neck Exercises: Machines for Strengthening   UBE (Upper Arm Bike) L2 3x3 with dicusiion of current status      Shoulder Exercises: Seated   Other Seated Exercises flexion to 90 degrees: 3# 10x, Scaption 3# 10x, abduction 2# 10x Bil      Shoulder  Exercises: ROM/Strengthening   Lat Pull --   3 plates 2x10     Traction   Type of Traction Cervical    Min (lbs) 5    Max (lbs) 17    Hold Time 60    Rest Time 10    Time 15                    PT Short Term Goals - 03/29/20 1115      PT SHORT TERM GOAL #1   Title be independent in initial HEP    Status Achieved      PT SHORT TERM GOAL #2   Title report 30% reduction in the frequency and intensity of neck pain and Lt UE pain    Baseline able to do push up the other day; first time in months    Status Achieved      PT SHORT TERM GOAL #3   Title demonstrate 4+/5 Lt shoulder strength throughout to improve endurance with use    Status Achieved  PT Long Term Goals - 04/26/20 1223      PT LONG TERM GOAL #1   Title be independent in advanced HEP    Status On-going      PT LONG TERM GOAL #3   Title report a 70% reduction in the frequency and intensity of neck pain and Lt UE radiculopathy    Baseline No Lt arm pain, neck pain 50% better    Status On-going      PT LONG TERM GOAL #4   Title tolerate sitting for 1.5 hours with mini break without increased neck or Lt UE pain    Status Partially Met      PT LONG TERM GOAL #5   Title demonstarte 5/5 Lt shoulder strength to improve endurance with use    Status On-going                 Plan - 05/03/20 0839    Clinical Impression Statement Pt had success shoveling snow/ice over the last few days: no increased/significant neck or shoulder pain. Pt is going to have a cortisone shot in his right knee next week. pt requested to keep the traction pull the same: "It is working good and I don't want to mess with it." Pt tolerated all UE strengthening today in the clinic.    Examination-Activity Limitations Sit;Sleep    Examination-Participation Restrictions Community Activity    Stability/Clinical Decision Making Stable/Uncomplicated    Rehab Potential Good    PT Frequency 2x / week    PT Duration 12  weeks    PT Treatment/Interventions ADLs/Self Care Home Management;Cryotherapy;Electrical Stimulation;Traction;Moist Heat;Functional mobility training;Therapeutic activities;Therapeutic exercise;Manual techniques;Patient/family education;Passive range of motion;Dry needling;Spinal Manipulations;Joint Manipulations;Taping    PT Next Visit Plan continue postural strength for thoracic and cervical, manual as needed, traction: pt has 3 visits left.    PT Home Exercise Plan Access Code: [email or web address]    Consulted and Agree with Plan of Care Patient           Patient will benefit from skilled therapeutic intervention in order to improve the following deficits and impairments:  Decreased activity tolerance,Postural dysfunction,Improper body mechanics,Impaired flexibility,Pain,Increased muscle spasms,Decreased range of motion  Visit Diagnosis: Cervicalgia  Pain in left arm  Muscle weakness (generalized)  Cramp and spasm     Problem List Patient Active Problem List   Diagnosis Date Noted  . Pulmonary emphysema ([place]) 12/09/2018  . Loud snoring 12/09/2018  . OSA and COPD overlap syndrome ([place]) 12/09/2018    Piya Mesch, PTA 05/03/2020, 9:02 AM  [facility] [address] [place], [place], [address] Phone: [phone]   Fax:  [phone]  Name: Mark Travis MRN: [id number] Date of Birth: Jul 03, 1950

## 2020-05-05 ENCOUNTER — Ambulatory Visit: Payer: Medicare HMO | Admitting: Physical Therapy

## 2020-05-05 ENCOUNTER — Other Ambulatory Visit: Payer: Self-pay

## 2020-05-05 ENCOUNTER — Encounter: Payer: Self-pay | Admitting: Physical Therapy

## 2020-05-05 DIAGNOSIS — M79602 Pain in left arm: Secondary | ICD-10-CM

## 2020-05-05 DIAGNOSIS — M542 Cervicalgia: Secondary | ICD-10-CM | POA: Diagnosis not present

## 2020-05-05 DIAGNOSIS — R252 Cramp and spasm: Secondary | ICD-10-CM

## 2020-05-05 DIAGNOSIS — M6281 Muscle weakness (generalized): Secondary | ICD-10-CM

## 2020-05-05 NOTE — Therapy (Signed)
[facility] [address] [place], [place], [address] Phone: [phone]   Fax:  [phone]  Physical Therapy Treatment  Patient Details  Name: Mark Travis MRN: [id number] Date of Birth: 07/22/1950 Referring Provider (PT): Melina Schools, MD   Encounter Date: 05/05/2020   PT End of Session - 05/05/20 0905    Visit Number 17    Date for PT Re-Evaluation 05/18/20    Authorization Type Cohere: 8 visits 1/1-2/3    Authorization - Visit Number 6    Authorization - Number of Visits 8    Progress Note Due on Visit 20    PT Start Time 0905    PT Stop Time 0947    PT Time Calculation (min) 42 min    Activity Tolerance Patient tolerated treatment well    Behavior During Therapy [facility] for tasks assessed/performed           Past Medical History:  Diagnosis Date  . Chronic low back pain   . Decreased hearing   . Diverticulosis   . Emphysema (subcutaneous) (surgical) resulting from a procedure   . Fatty liver   . Glaucoma   . Hiatal hernia   . IBS (irritable bowel syndrome)   . OSA on CPAP   . Prostatitis   . Seasonal allergies     Past Surgical History:  Procedure Laterality Date  . COLONOSCOPY  10/2012  . L trigger finger release      There were no vitals filed for this visit.   Subjective Assessment - 05/05/20 0906    Subjective Felt fine after last session, did a litle more shoveling and had some shoulder balde tingles, they subsided. Currently no pain.    Patient Stated Goals reduce Lt UE pain, reduce neck pain    Currently in Pain? No/denies                             [facility] Adult PT Treatment/Exercise - 05/05/20 0001      Neck Exercises: Machines for Strengthening   UBE (Upper Arm Bike) L2 3x3 with discussion of current status      Shoulder Exercises: Seated   Other Seated Exercises flexion to 90 degrees: 3# 10x, Scaption 3# 10x, abduction 2# 10x Bil   VC for greater thoracic  extension   Other Seated Exercises Biceps 9# 2x10 in supination and neutral forearm      Traction   Type of Traction --   Declined, did at home this AM   Min (lbs) --    Max (lbs) --    Hold Time --    Rest Time --    Time --      Manual Therapy   Soft tissue mobilization Scalenes, SCM, suboccipitals, cervical paraspinals                    PT Short Term Goals - 03/29/20 1115      PT SHORT TERM GOAL #1   Title be independent in initial HEP    Status Achieved      PT SHORT TERM GOAL #2   Title report 30% reduction in the frequency and intensity of neck pain and Lt UE pain    Baseline able to do push up the other day; first time in months    Status Achieved      PT SHORT TERM GOAL #3   Title demonstrate 4+/5 Lt shoulder strength  throughout to improve endurance with use    Status Achieved             PT Long Term Goals - 04/26/20 1223      PT LONG TERM GOAL #1   Title be independent in advanced HEP    Status On-going      PT LONG TERM GOAL #3   Title report a 70% reduction in the frequency and intensity of neck pain and Lt UE radiculopathy    Baseline No Lt arm pain, neck pain 50% better    Status On-going      PT LONG TERM GOAL #4   Title tolerate sitting for 1.5 hours with mini break without increased neck or Lt UE pain    Status Partially Met      PT LONG TERM GOAL #5   Title demonstarte 5/5 Lt shoulder strength to improve endurance with use    Status On-going                 Plan - 05/05/20 0942    Clinical Impression Statement Pt continues to do well. Had some shoulder blade twinges after day 2 of shoveling, but this abolished in time. Pt performed home traction this AM and requested soft tissue work today. Scalene group and OA tight but improved withinthe session. Pt on track for discharge in 2 remaining visits.    Examination-Activity Limitations Sit;Sleep    Examination-Participation Restrictions Community Activity     Stability/Clinical Decision Making Stable/Uncomplicated    Rehab Potential Good    PT Frequency 2x / week    PT Duration 12 weeks    PT Treatment/Interventions ADLs/Self Care Home Management;Cryotherapy;Electrical Stimulation;Traction;Moist Heat;Functional mobility training;Therapeutic activities;Therapeutic exercise;Manual techniques;Patient/family education;Passive range of motion;Dry needling;Spinal Manipulations;Joint Manipulations;Taping    PT Next Visit Plan 2 visits more then probable DC at end of cert period.    PT Home Exercise Plan Access Code: [email or web address]    Consulted and Agree with Plan of Care Patient           Patient will benefit from skilled therapeutic intervention in order to improve the following deficits and impairments:  Decreased activity tolerance,Postural dysfunction,Improper body mechanics,Impaired flexibility,Pain,Increased muscle spasms,Decreased range of motion  Visit Diagnosis: Cervicalgia  Pain in left arm  Muscle weakness (generalized)  Cramp and spasm     Problem List Patient Active Problem List   Diagnosis Date Noted  . Pulmonary emphysema ([place]) 12/09/2018  . Loud snoring 12/09/2018  . OSA and COPD overlap syndrome ([place]) 12/09/2018    Amarie Viles, PTA 05/05/2020, 9:50 AM  [facility] [address], [place], [place], [address] Phone: [phone]   Fax:  [phone]  Name: Lance Huaracha MRN: [id number] Date of Birth: 1950/10/24

## 2020-05-09 ENCOUNTER — Other Ambulatory Visit: Payer: Self-pay

## 2020-05-09 ENCOUNTER — Ambulatory Visit: Payer: Medicare HMO

## 2020-05-09 DIAGNOSIS — M542 Cervicalgia: Secondary | ICD-10-CM | POA: Diagnosis not present

## 2020-05-09 DIAGNOSIS — M79602 Pain in left arm: Secondary | ICD-10-CM

## 2020-05-09 DIAGNOSIS — M6281 Muscle weakness (generalized): Secondary | ICD-10-CM

## 2020-05-09 DIAGNOSIS — R252 Cramp and spasm: Secondary | ICD-10-CM

## 2020-05-09 NOTE — Therapy (Signed)
[facility] [address], [place], [place], [address] Phone: [phone]   Fax:  [phone]  Physical Therapy Treatment  Patient Details  Name: Mark Travis MRN: [id number] Date of Birth: 01/20/1951 Referring Provider (PT): Melina Schools, MD   Encounter Date: 05/09/2020   PT End of Session - 05/09/20 0947    Visit Number 18    Authorization Type Cohere: 8 visits 1/1-2/3    Authorization - Visit Number 7    Authorization - Number of Visits 8    Progress Note Due on Visit 20    PT Start Time 0923    PT Stop Time 1016    PT Time Calculation (min) 53 min    Activity Tolerance Patient tolerated treatment well    Behavior During Therapy [facility] for tasks assessed/performed           Past Medical History:  Diagnosis Date  . Chronic low back pain   . Decreased hearing   . Diverticulosis   . Emphysema (subcutaneous) (surgical) resulting from a procedure   . Fatty liver   . Glaucoma   . Hiatal hernia   . IBS (irritable bowel syndrome)   . OSA on CPAP   . Prostatitis   . Seasonal allergies     Past Surgical History:  Procedure Laterality Date  . COLONOSCOPY  10/2012  . L trigger finger release      There were no vitals filed for this visit.   Subjective Assessment - 05/09/20 0924    Subjective I'm feeling depressed, we had to put our dog down.  My mid back is sore from lifting her into the car.  My neck feels 60% better overall.    Currently in Pain? Yes    Pain Score 3    sore from lifting   Pain Location Thoracic   thoracic spine   Pain Orientation Left;Right    Pain Descriptors / Indicators Sore    Pain Type Chronic pain    Pain Onset More than a month ago    Pain Frequency Intermittent    Aggravating Factors  lifting, not moving enough    Pain Relieving Factors heat, advil, massage              [facility] PT Assessment - 05/09/20 0001      Observation/Other Assessments   Focus on Therapeutic  Outcomes (FOTO)  40% limitation      Strength   Overall Strength Comments Bil shoulders 5/5                         [facility] Adult PT Treatment/Exercise - 05/09/20 0001      Neck Exercises: Machines for Strengthening   UBE (Upper Arm Bike) L2 3x3 with discussion of current status      Shoulder Exercises: Seated   Other Seated Exercises flexion to 90 degrees: 3# 2x10, Scaption 3# 10x, abduction 2# 10x Bil   VC for greater thoracic extension   Other Seated Exercises Biceps 9# 2x10 in supination and neutral forearm      Traction   Type of Traction Cervical    Min (lbs) 5    Max (lbs) 17    Hold Time 60    Rest Time 10    Time 15      Manual Therapy   Soft tissue mobilization Scalenes, SCM, suboccipitals, cervical paraspinals    Passive ROM into lateral flexion; passive stretch of scalenes, levator UT  PT Short Term Goals - 03/29/20 1115      PT SHORT TERM GOAL #1   Title be independent in initial HEP    Status Achieved      PT SHORT TERM GOAL #2   Title report 30% reduction in the frequency and intensity of neck pain and Lt UE pain    Baseline able to do push up the other day; first time in months    Status Achieved      PT SHORT TERM GOAL #3   Title demonstrate 4+/5 Lt shoulder strength throughout to improve endurance with use    Status Achieved             PT Long Term Goals - 05/09/20 0929      PT LONG TERM GOAL #2   Title reduce FOTO to < or = to 37% limitation    Baseline 40% limitation    Status Partially Met      PT LONG TERM GOAL #3   Title report a 70% reduction in the frequency and intensity of neck pain and Lt UE radiculopathy    Baseline 60-65%    Status On-going      PT LONG TERM GOAL #4   Title tolerate sitting for 1.5 hours with mini break without increased neck or Lt UE pain    Status Achieved      PT LONG TERM GOAL #6   Title return to regular exercise routine at the [facility] without limitation or  increased pain    Baseline hasnt tried this yet    Status Deferred                 Plan - 05/09/20 0947    Clinical Impression Statement Pt reports 60-65% overall improvement in neck symptoms since the start of care.  Pt with increased thoracic pain due to lifting his dog yesterday.  Pt with improved postural awareness and Lt shoulder strength is 5/5 throughout.  FOTO is improved to 40% limitation.   Pt with mild tension in bil neck and responded well to soft tissue work today.  Pt will attend 1 more session and will D/C next session to HEP.    PT Frequency 2x / week    PT Duration 12 weeks    PT Treatment/Interventions ADLs/Self Care Home Management;Cryotherapy;Electrical Stimulation;Traction;Moist Heat;Functional mobility training;Therapeutic activities;Therapeutic exercise;Manual techniques;Patient/family education;Passive range of motion;Dry needling;Spinal Manipulations;Joint Manipulations;Taping    PT Next Visit Plan 1 more session.  Finalize HEP    PT Home Exercise Plan Access Code: [email or web address]    Consulted and Agree with Plan of Care Patient           Patient will benefit from skilled therapeutic intervention in order to improve the following deficits and impairments:  Decreased activity tolerance,Postural dysfunction,Improper body mechanics,Impaired flexibility,Pain,Increased muscle spasms,Decreased range of motion  Visit Diagnosis: Cervicalgia  Pain in left arm  Muscle weakness (generalized)  Cramp and spasm     Problem List Patient Active Problem List   Diagnosis Date Noted  . Pulmonary emphysema ([place]) 12/09/2018  . Loud snoring 12/09/2018  . OSA and COPD overlap syndrome ([place]) 12/09/2018    Sigurd Sos, PT 05/09/20 10:06 AM  [facility] [address] [place], [place], [address] Phone: [phone]   Fax:  [phone]  Name: Almus Woodham MRN: [id number] Date of Birth: [birth year]

## 2020-05-11 ENCOUNTER — Other Ambulatory Visit: Payer: Self-pay

## 2020-05-11 ENCOUNTER — Ambulatory Visit: Payer: Medicare HMO

## 2020-05-11 DIAGNOSIS — M542 Cervicalgia: Secondary | ICD-10-CM

## 2020-05-11 DIAGNOSIS — M79602 Pain in left arm: Secondary | ICD-10-CM

## 2020-05-11 DIAGNOSIS — M6281 Muscle weakness (generalized): Secondary | ICD-10-CM

## 2020-05-11 DIAGNOSIS — R252 Cramp and spasm: Secondary | ICD-10-CM

## 2020-05-11 NOTE — Therapy (Signed)
[facility] [address], [place], [place], [address] Phone: [phone]   Fax:  [phone]  Physical Therapy Treatment  Patient Details  Name: Mark Travis MRN: [id number] Date of Birth: [birth year] Referring Provider (PT): Melina Schools, MD   Encounter Date: 05/11/2020   PT End of Session - 05/11/20 1005    Visit Number 46    Authorization - Visit Number 8    Authorization - Number of Visits 8    PT Start Time 1324    PT Stop Time 1020    PT Time Calculation (min) 49 min    Activity Tolerance Patient tolerated treatment well    Behavior During Therapy [facility] for tasks assessed/performed           Past Medical History:  Diagnosis Date  . Chronic low back pain   . Decreased hearing   . Diverticulosis   . Emphysema (subcutaneous) (surgical) resulting from a procedure   . Fatty liver   . Glaucoma   . Hiatal hernia   . IBS (irritable bowel syndrome)   . OSA on CPAP   . Prostatitis   . Seasonal allergies     Past Surgical History:  Procedure Laterality Date  . COLONOSCOPY  10/2012  . L trigger finger release      There were no vitals filed for this visit.   Subjective Assessment - 05/11/20 0936    Subjective I'm feeling a little better after heavy lifting earlier this week.  Ready for D/C to HEP.  My neck feels 60% better.    Currently in Pain? No/denies              [facility] PT Assessment - 05/11/20 0001      Assessment   Medical Diagnosis cervicalgia    Referring Provider (PT) Melina Schools, MD    Onset Date/Surgical Date 12/25/19      Observation/Other Assessments   Focus on Therapeutic Outcomes (FOTO)  40% limitation      Strength   Overall Strength Comments Bil shoulders 5/5                         [facility] Adult PT Treatment/Exercise - 05/11/20 0001      Neck Exercises: Machines for Strengthening   UBE (Upper Arm Bike) L2 3x3 with discussion of current status       Shoulder Exercises: Seated   Other Seated Exercises flexion to 90 degrees: 3# 2x10, Scaption 3# 10x, abduction 2# 10x Bil   VC for greater thoracic extension   Other Seated Exercises Biceps 9# 2x10 in supination and neutral forearm      Traction   Type of Traction Cervical    Min (lbs) 5    Max (lbs) 17    Hold Time 60    Rest Time 10    Time 15      Manual Therapy   Soft tissue mobilization Scalenes, SCM, suboccipitals, cervical paraspinals    Passive ROM into lateral flexion; passive stretch of scalenes, levator UT                    PT Short Term Goals - 03/29/20 1115      PT SHORT TERM GOAL #1   Title be independent in initial HEP    Status Achieved      PT SHORT TERM GOAL #2   Title report 30% reduction in the frequency and intensity of neck pain  and Lt UE pain    Baseline able to do push up the other day; first time in months    Status Achieved      PT SHORT TERM GOAL #3   Title demonstrate 4+/5 Lt shoulder strength throughout to improve endurance with use    Status Achieved             PT Long Term Goals - 05/11/20 3818      PT LONG TERM GOAL #1   Title be independent in advanced HEP    Status Achieved      PT LONG TERM GOAL #2   Title reduce FOTO to < or = to 37% limitation    Baseline 40% limitation    Status Partially Met      PT LONG TERM GOAL #3   Title report a 70% reduction in the frequency and intensity of neck pain and Lt UE radiculopathy    Baseline 60-65%    Status Partially Met      PT LONG TERM GOAL #4   Title tolerate sitting for 1.5 hours with mini break without increased neck or Lt UE pain    Status Achieved      PT LONG TERM GOAL #5   Title demonstarte 5/5 Lt shoulder strength to improve endurance with use    Status Achieved      PT LONG TERM GOAL #6   Title return to regular exercise routine at the [facility] without limitation or increased pain    Status Deferred                 Plan - 05/11/20 1009    Clinical  Impression Statement Pt reports 60-65% overall improvement in neck symptoms since the start of care.  Pain has resolved after heavy lifting earlier this week.  Pt with improved postural awareness and Lt shoulder strength is 5/5 throughout.  FOTO is improved to 40% limitation.   Pt with mild tension in bil neck and responded well to soft tissue work today. Pt will D/C to HEP and use of traction unit at home.    PT Next Visit Plan D/C PT to HEP    PT Home Exercise Plan Access Code: [email or web address]    Consulted and Agree with Plan of Care Patient           Patient will benefit from skilled therapeutic intervention in order to improve the following deficits and impairments:     Visit Diagnosis: Cervicalgia  Pain in left arm  Muscle weakness (generalized)  Cramp and spasm     Problem List Patient Active Problem List   Diagnosis Date Noted  . Pulmonary emphysema ([place]) 12/09/2018  . Loud snoring 12/09/2018  . OSA and COPD overlap syndrome ([place]) 12/09/2018   PHYSICAL THERAPY DISCHARGE SUMMARY  Visits from Start of Care: 19  Current functional level related to goals / functional outcomes: See above for current status.     Remaining deficits: Neck stiffness and intermittent pain.  Pt has HEP in place.   Education / Equipment: HEP, posture/body mechanics Plan: Patient agrees to discharge.  Patient goals were partially met. Patient is being discharged due to being pleased with the current functional level.  ?????     Sigurd Sos, PT 05/11/20 10:10 AM  [facility] [address], [place], [place], [address] Phone: [phone]   Fax:  [phone]  Name: Mark Travis MRN: [id number] Date of Birth: 05-16-1950

## 2020-05-23 ENCOUNTER — Ambulatory Visit
Admission: RE | Admit: 2020-05-23 | Discharge: 2020-05-23 | Disposition: A | Discharge status: Home / Self Care | Payer: Medicare HMO | Source: Ambulatory Visit | Attending: Physician Assistant | Admitting: Physician Assistant

## 2020-05-23 DIAGNOSIS — R351 Nocturia: Secondary | ICD-10-CM | POA: Insufficient documentation

## 2020-05-23 DIAGNOSIS — R911 Solitary pulmonary nodule: Secondary | ICD-10-CM

## 2020-05-31 ENCOUNTER — Other Ambulatory Visit: Payer: Self-pay | Admitting: Physician Assistant

## 2020-05-31 DIAGNOSIS — E041 Nontoxic single thyroid nodule: Secondary | ICD-10-CM

## 2020-06-12 ENCOUNTER — Other Ambulatory Visit: Payer: Self-pay

## 2020-06-12 ENCOUNTER — Ambulatory Visit
Admission: RE | Admit: 2020-06-12 | Discharge: 2020-06-12 | Disposition: A | Discharge status: Home / Self Care | Payer: Medicare HMO | Source: Ambulatory Visit | Attending: Physician Assistant | Admitting: Physician Assistant

## 2020-06-12 DIAGNOSIS — E041 Nontoxic single thyroid nodule: Secondary | ICD-10-CM

## 2020-11-06 ENCOUNTER — Other Ambulatory Visit: Payer: Self-pay | Admitting: Physician Assistant

## 2020-11-06 DIAGNOSIS — Z136 Encounter for screening for cardiovascular disorders: Secondary | ICD-10-CM

## 2020-11-06 DIAGNOSIS — R911 Solitary pulmonary nodule: Secondary | ICD-10-CM

## 2020-11-10 ENCOUNTER — Ambulatory Visit
Admission: RE | Admit: 2020-11-10 | Discharge: 2020-11-10 | Disposition: A | Discharge status: Home / Self Care | Payer: Medicare HMO | Source: Ambulatory Visit | Attending: Physician Assistant | Admitting: Physician Assistant

## 2020-11-10 ENCOUNTER — Other Ambulatory Visit: Payer: Self-pay

## 2020-11-10 DIAGNOSIS — Z136 Encounter for screening for cardiovascular disorders: Secondary | ICD-10-CM

## 2021-05-09 DIAGNOSIS — E042 Nontoxic multinodular goiter: Secondary | ICD-10-CM | POA: Insufficient documentation

## 2021-05-10 ENCOUNTER — Other Ambulatory Visit: Payer: Self-pay | Admitting: Physician Assistant

## 2021-05-10 DIAGNOSIS — E041 Nontoxic single thyroid nodule: Secondary | ICD-10-CM

## 2021-05-25 ENCOUNTER — Ambulatory Visit
Admission: RE | Admit: 2021-05-25 | Discharge: 2021-05-25 | Disposition: A | Discharge status: Home / Self Care | Payer: Medicare HMO | Source: Ambulatory Visit | Attending: Physician Assistant | Admitting: Physician Assistant

## 2021-05-25 ENCOUNTER — Other Ambulatory Visit: Payer: Self-pay

## 2021-05-25 DIAGNOSIS — R911 Solitary pulmonary nodule: Secondary | ICD-10-CM

## 2021-05-25 DIAGNOSIS — E041 Nontoxic single thyroid nodule: Secondary | ICD-10-CM

## 2021-06-25 IMAGING — CT CT CHEST LCS NODULE FOLLOW-UP W/O CM
1 series · 15 of 31 positions shown, 19 images · non-contrast
Comparison: 11/12/2019 and 04/27/2018.

CLINICAL DATA: Six-month follow-up for pulmonary nodule, abnormal
lung cancer screening CT, current smoker, 42 pack-year history.

EXAM:
CT CHEST WITHOUT CONTRAST FOR LUNG CANCER SCREENING NODULE FOLLOW-UP
TECHNIQUE: Multidetector CT imaging of the chest was performed following the
standard protocol without IV contrast.

[Series 2: lcs nodule f/u · axial · 0.82mm/px · z∈[-384,-44]mm · 15 of 74 slices shown, 19 images]
[im 3/74  mediastinal]
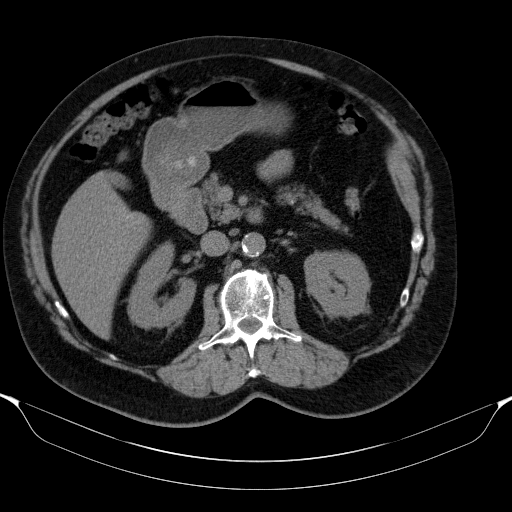
[im 3/74  lung]
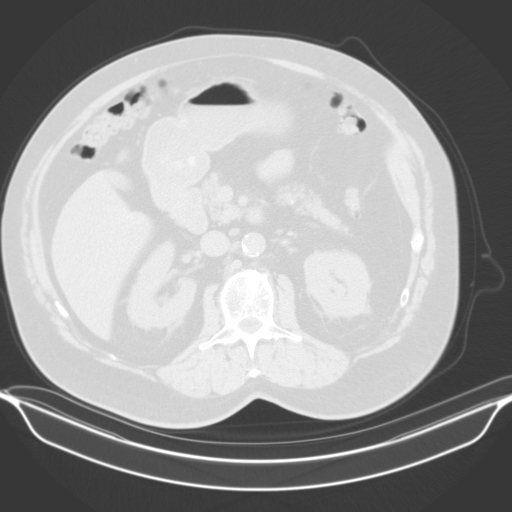
[im 9/74  lung]
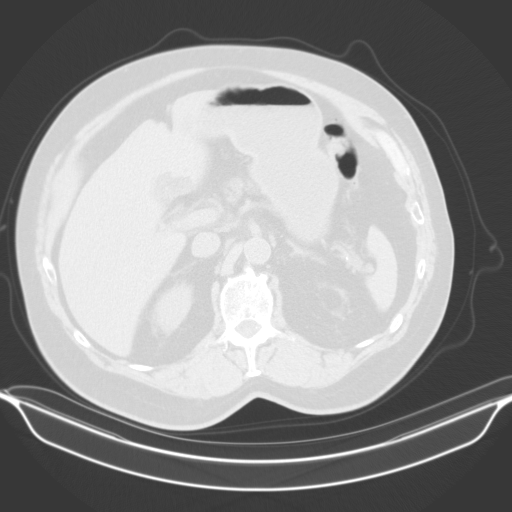
[im 14/74  lung]
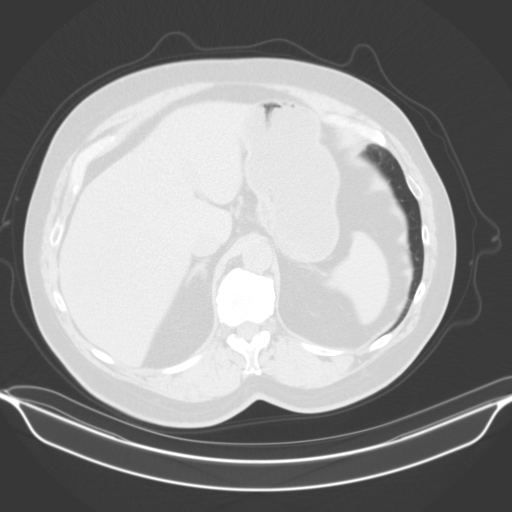
[im 17/74  lung]
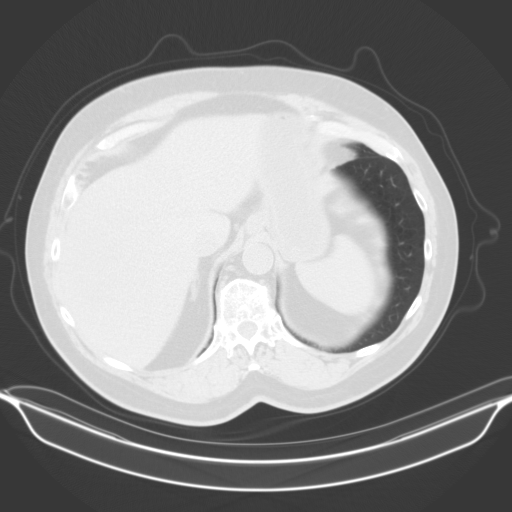
[im 22/74  mediastinal]
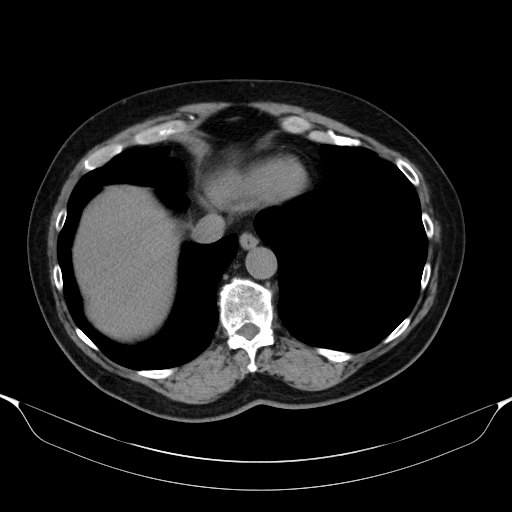
[im 22/74  lung]
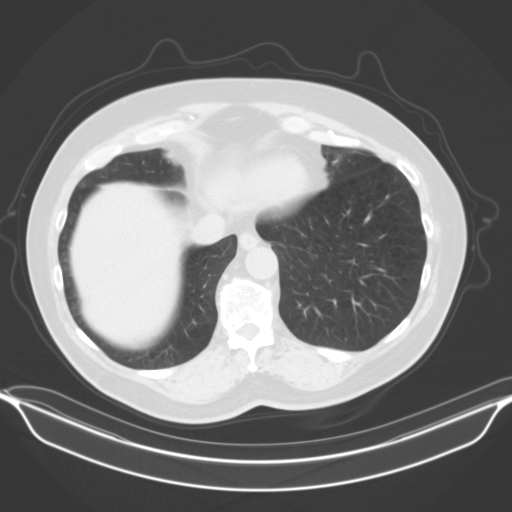
[im 28/74  lung]
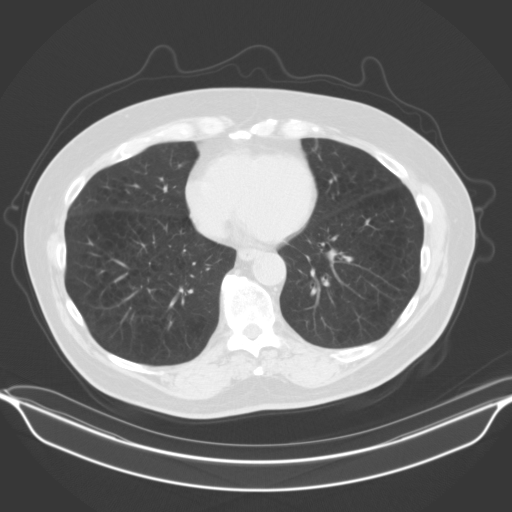
[im 33/74  lung]
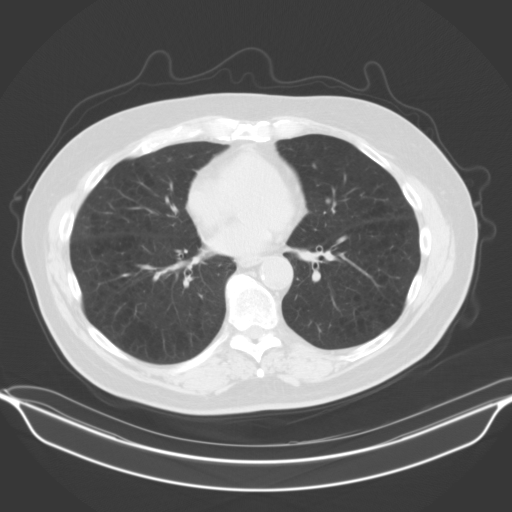
[im 38/74  lung]
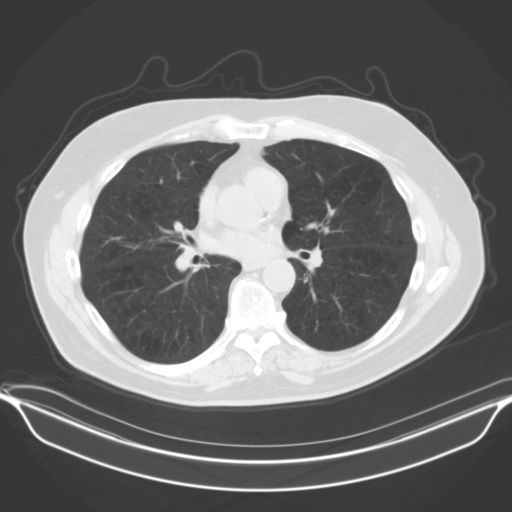
[im 41/74  mediastinal]
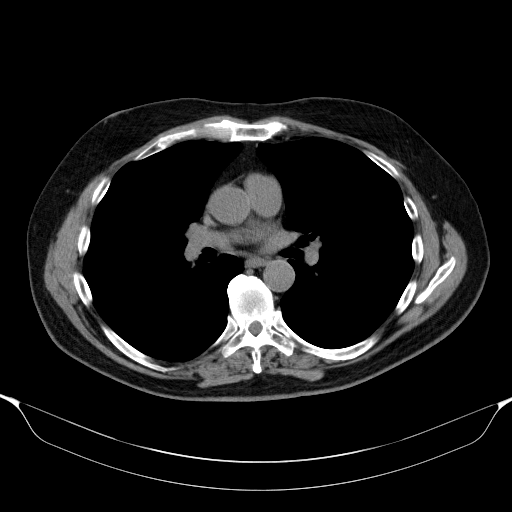
[im 41/74  lung]
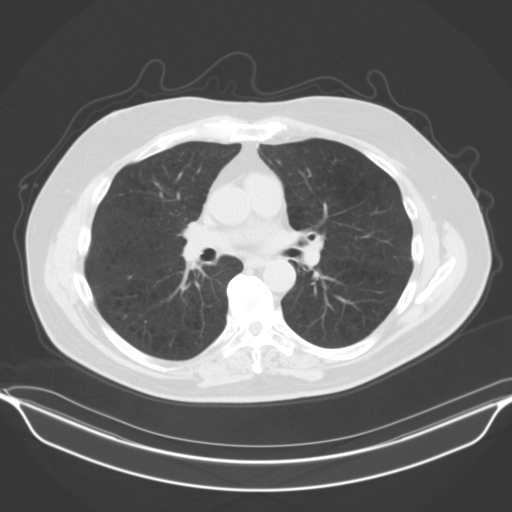
[im 46/74  lung]
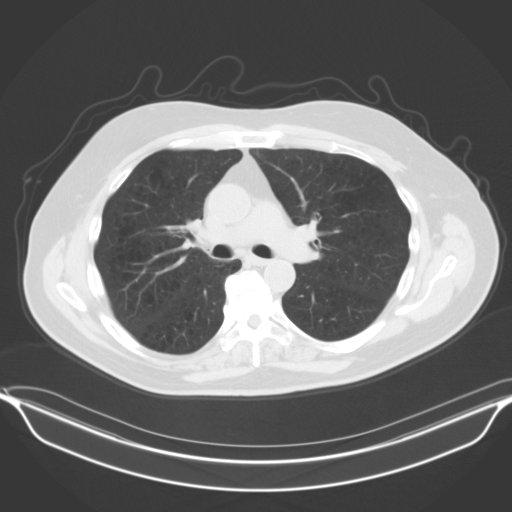
[im 52/74  lung]
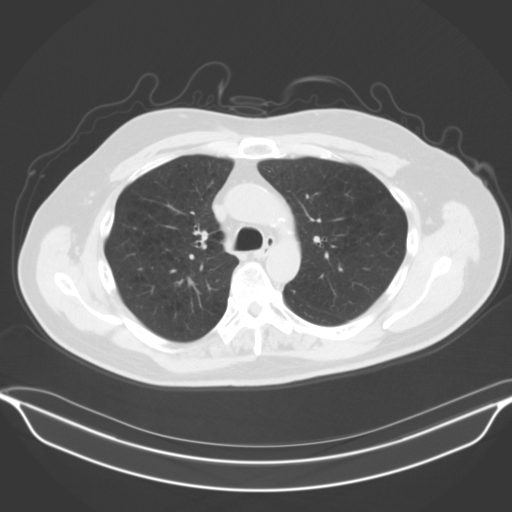
[im 57/74  lung]
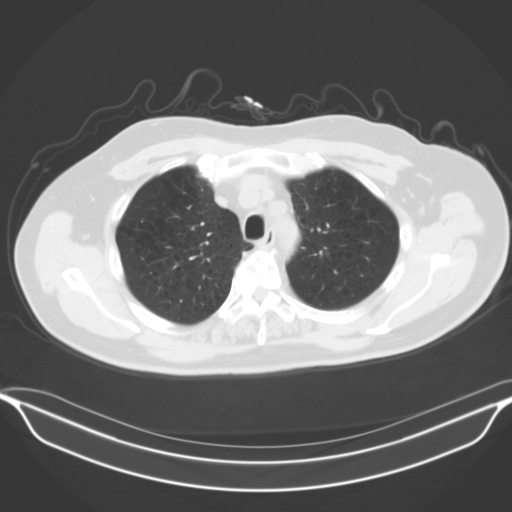
[im 60/74  mediastinal]
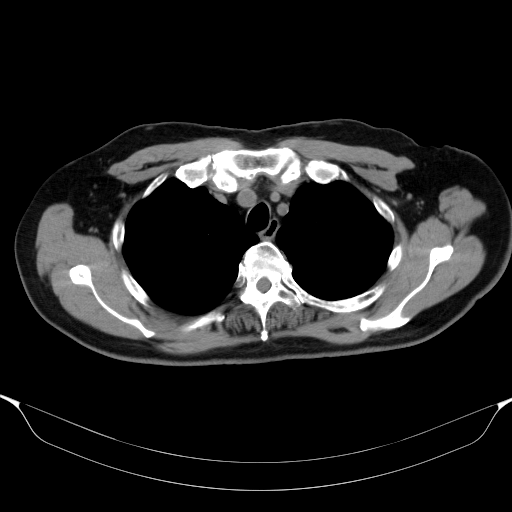
[im 60/74  lung]
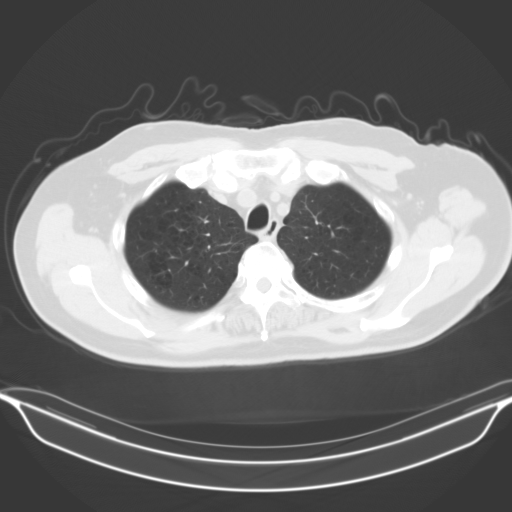
[im 65/74  lung]
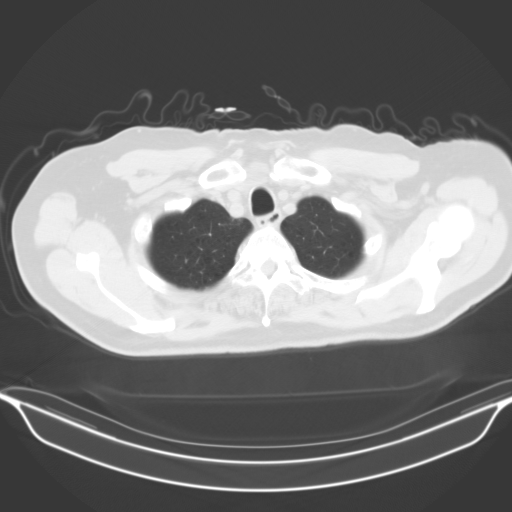
[im 71/74  lung]
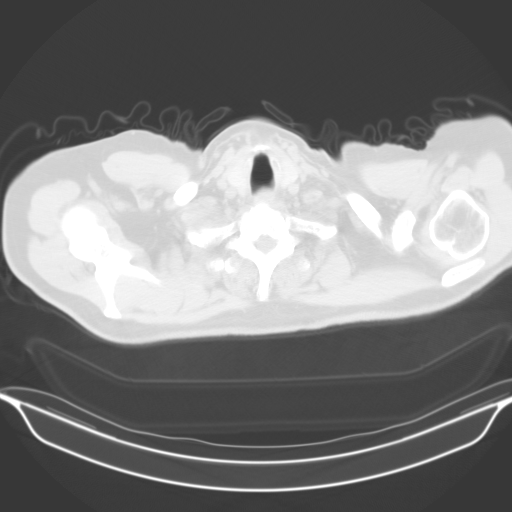

[15 of 31 positions shown; findings below may reference images not displayed]

FINDINGS: Cardiovascular: Atherosclerotic calcification of the aorta and
coronary arteries. Heart size normal. No pericardial effusion.

Mediastinum/Nodes: Low-attenuation nodules in the thyroid measure up
to 1.6 cm on the right, similar. No pathologically enlarged
mediastinal or axillary lymph nodes. Hilar regions are difficult to
definitively evaluate without IV contrast but appear grossly
unremarkable. Esophagus is grossly unremarkable.

Lungs/Pleura: Centrilobular and paraseptal emphysema. Previously
seen 5.5 mm nodule right upper lobe has resolved in the interval.
Other pulmonary nodules measure 4.5 mm or less in size, as before.
No pleural fluid. Minimal debris in the airway.

Upper Abdomen: Focal fat is seen in segment 4 of the liver, more
pronounced than on the prior study. Visualized portions of the
liver, gallbladder, adrenal glands and right kidney are otherwise
unremarkable. Subcentimeter exophytic lesion off the left kidney is
too small to characterize. Visualized portions of the spleen,
pancreas, stomach and bowel are grossly unremarkable. No upper
abdominal adenopathy.

Musculoskeletal: Degenerative changes in the spine. No worrisome
lytic or sclerotic lesions.
IMPRESSION: 1. Previously seen 5.5 mm right upper lobe nodule has resolved in
the interval. Lung-RADS 2, benign appearance or behavior. Continue
annual screening with low-dose chest CT without contrast in 12
months.
2. 1.6 cm low-attenuation right thyroid nodule. Recommend thyroid
US, as clinically indicated, as this patient underwent thyroid
biopsy 03/12/2016 and ultrasound 02/22/2016 (ref: [HOSPITAL].
[DATE]): 143-50).
3. Aortic atherosclerosis (H13K2-DHM.M). Coronary artery
calcification.
4.  Emphysema (H13K2-DR2.C).

## 2021-08-07 DIAGNOSIS — J301 Allergic rhinitis due to pollen: Secondary | ICD-10-CM | POA: Insufficient documentation

## 2021-08-07 DIAGNOSIS — J310 Chronic rhinitis: Secondary | ICD-10-CM | POA: Insufficient documentation

## 2021-08-07 DIAGNOSIS — J3081 Allergic rhinitis due to animal (cat) (dog) hair and dander: Secondary | ICD-10-CM | POA: Insufficient documentation

## 2021-08-07 DIAGNOSIS — H1045 Other chronic allergic conjunctivitis: Secondary | ICD-10-CM | POA: Insufficient documentation

## 2021-08-21 ENCOUNTER — Telehealth: Payer: Self-pay

## 2021-08-21 NOTE — Telephone Encounter (Signed)
NOTES SCANNED TO REFERRAL 

## 2021-08-26 NOTE — Progress Notes (Signed)
?Cardiology Office Note:   ? ?Date:  08/28/2021  ? ?ID:  Mark Travis, DOB 1950/05/12, MRN [id number] ? ?PCP:  Heywood Bene, PA-C ?  ?[facility] Providers ?Cardiologist:  Werner Lean, MD    ? ?Referring MD: Donnajean Lopes, MD  ? ?CC: Neck pain, leg pain ?Consulted for the evaluation of HTN at the behest of Heywood Bene, [place] ? ?History of Present Illness:   ? ?Mark Travis is a 71 y.o. male with a hx of HTN, DM, with difficult to control BP.  NAFLD and atorvastatin myalgia on repatha (LDL 37), stopped amlodipine in the based because of abdominal pain with mild improvement off change in amlodipine, active tobacco use.  Seen 08/28/21. ? ?Patient notes that he is feeling pretty good.  Has had no chest pain, chest pressure, chest tightness, chest stinging .  ? ?Discomfort occurs with exertional leg pain and a sense of uneasiness improves but doesn't always resolve with rest.   ? ?Patient exertion notable for going up and down stairs with no significant change in leg pain.  No shortness of breath, DOE with exertional.  No PND or orthopnea.  No weight gain, leg swelling , or abdominal swelling.  No syncope or near syncope. Notes no palpitations or funny heart beats.    ? ?No prior heart testing. ? ?Has sleeping Neck pain that radiates to his left arm.  No exertional component.  Has LM Calcium. ? ? ?Past Medical History:  ?Diagnosis Date  ? Chronic low back pain   ? Decreased hearing   ? Diverticulosis   ? Emphysema (subcutaneous) (surgical) resulting from a procedure   ? Fatty liver   ? Glaucoma   ? Hiatal hernia   ? IBS (irritable bowel syndrome)   ? OSA on CPAP   ? Prostatitis   ? Seasonal allergies   ? ? ?Past Surgical History:  ?Procedure Laterality Date  ? COLONOSCOPY  10/2012  ? L trigger finger release    ? ? ?Current Medications: ?Current Meds  ?Medication Sig  ? b complex vitamins capsule Take 1 capsule by mouth daily.  ? dorzolamide-timolol (COSOPT) 22.3-6.8 MG/ML ophthalmic  solution   ? ECHINACEA PO Take 1 tablet by mouth daily.  ? ELDERBERRY PO Take 1 tablet by mouth daily.  ? fluticasone (VERAMYST) 27.5 MCG/SPRAY nasal spray as needed.  ? gabapentin (NEURONTIN) 300 MG capsule Take 300 mg by mouth as needed.  ? hydrochlorothiazide (MICROZIDE) 12.5 MG capsule Take 1 capsule (12.5 mg total) by mouth daily.  ? ibuprofen (ADVIL) 800 MG tablet as needed.  ? latanoprost (XALATAN) 0.005 % ophthalmic solution   ? losartan (COZAAR) 100 MG tablet Take 100 mg by mouth daily.  ? metFORMIN (GLUCOPHAGE-XR) 500 MG 24 hr tablet 1 tablet 2 (two) times a day.  ? Omega-3 Fatty Acids (OMEGA-3 FISH OIL PO) Take by mouth.  ? REPATHA SURECLICK 916 MG/ML SOAJ 606 mg. Every 2 weeks  ? Saw Palmetto, Serenoa repens, (SAW PALMETTO PO) Take 1 tablet by mouth daily.  ? SODIUM FLUORIDE 5000 SENSITIVE 1.1-5 % GEL Take by mouth at bedtime.  ? tiZANidine (ZANAFLEX) 4 MG tablet Take 4 mg by mouth as needed. Muscle spasms  ? VITAMIN A PO Take 1 tablet by mouth daily.  ? VITAMIN D PO Take 1 tablet by mouth daily.  ? VITAMIN E PO Take 1 tablet by mouth daily.  ? zinc gluconate 50 MG tablet Take by mouth daily.  ? [DISCONTINUED] amLODipine (NORVASC) 5  MG tablet Take 5 mg by mouth daily.  ? [DISCONTINUED] finasteride (PROSCAR) 5 MG tablet   ? [DISCONTINUED] Zoster Vaccine Adjuvanted [facility]) injection Shingrix (PF) 50 mcg/0.5 mL intramuscular suspension, kit ? TO BE ADMINISTERED BY PHARMACIST FOR IMMUNIZATION  ?  ? ?Allergies:   Brimonidine tartrate-timolol, Cinnamon, and Statins  ? ?Social History  ? ?Socioeconomic History  ? Marital status: Married  ?  Spouse name: Not on file  ? Number of children: Not on file  ? Years of education: Not on file  ? Highest education level: Not on file  ?Occupational History  ? Not on file  ?Tobacco Use  ? Smoking status: Former  ?  Packs/day: 1.00  ?  Years: 34.00  ?  Pack years: 34.00  ?  Types: Cigarettes  ? Smokeless tobacco: Never  ?Substance and Sexual Activity  ? Alcohol use: Yes   ? Drug use: Not Currently  ? Sexual activity: Not on file  ?Other Topics Concern  ? Not on file  ?Social History Narrative  ? Not on file  ? ?Social Determinants of Health  ? ?Financial Resource Strain: Not on file  ?Food Insecurity: Not on file  ?Transportation Needs: Not on file  ?Physical Activity: Not on file  ?Stress: Not on file  ?Social Connections: Not on file  ?  ? ?Family History: ?The patient's family history includes Alcoholism in his maternal grandmother; Arthritis in his mother; Heart disease in his mother; Hypertension in his mother. ? ?ROS:   ?Please see the history of present illness.    ?All other systems reviewed and are negative. ? ?EKGs/Labs/Other Studies Reviewed:   ? ?The following studies were reviewed today: ? ?EKG:  EKG is  ordered today.  The ekg ordered today demonstrates  ?08/28/21: SR rate 76  ? 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 ? ? ?Recent Labs: ?No results found for requested labs within last 8760 hours.  ?Recent Lipid Panel ?No results found for: CHOL, TRIG, HDL, CHOLHDL, VLDL, LDLCALC, LDLDIRECT ? ?    ? ?Physical Exam:   ? ?VS:  BP (!) 142/88   Pulse 76   Ht 5' 11"  (1.803 m)   Wt 200 lb (90.7 kg)   SpO2 98%   BMI 27.89 kg/m?    ? ?Wt Readings from Last 3 Encounters:  ?08/28/21 200 lb (90.7 kg)  ?03/04/19 190 lb 9.6 oz (86.5 kg)  ?10/27/18 190 lb (86.2 kg)  ?  ? ?Gen: no distress  ?Neck: No JVD ?Ears: Bilateral Pilar Plate Sign ?Cardiac: No Rubs or Gallops, no murmur, RRR +2 radial pulses ?Respiratory: Clear to auscultation bilaterally, normal effort, normal  respiratory rate ?GI: Soft, nontender, non-distended  ?MS: No  edema;  moves all extremities ?Integument: Skin feels warm, decreased hair in patches with shiny appearance (baseline) ?Neuro:  At time of evaluation, alert and oriented to person/place/time/situation  ?Psych: Normal affect, patient feels  ? ? ?ASSESSMENT:   ? ?1. Chest  pain of uncertain etiology   ?2. Right carotid bruit   ?3. Claudication of both lower extremities ([place])   ?4. Tobacco abuse   ?5. Left leg claudication ([place])   ?6. Right leg claudication ([place])   ?7. Hypertension associated with diabetes ([place])   ? ?PLAN:   ? ?Neck pain radiating to his arm ?Query or angina ?LM Calcium ?Aortic atherosclerosis ?Atorvastatin myalgia ?- start ASA 81 mg PO daily ?- will get Lexiscan give  left main calcium; otherwise conservative mgmt ?- LDL 37 on repatha ? ?HTN with DM ?- he has concern for novasc related abdominal pain ?- contine losartan 100 mg PO daily ?- add HCTZ 12.5 mg PO daily and BMP in one week ? ? ?Tobacco abuse ?- we have set a quit date for 6 months and are weaning to 2 packs per week this week from PPD ? ?R Brachiocephalic stenosis ?R carotid bruit ?- R UE Duplex ? ?Leg claudication ?- ABIs and arterial dupelx ? ?6 months f/u me or APP ? ?   ? ?  ? ? ?Medication Adjustments/Labs and Tests Ordered: ?Current medicines are reviewed at length with the patient today.  Concerns regarding medicines are outlined above.  ?Orders Placed This Encounter  ?Procedures  ? Basic metabolic panel  ? Cardiac Stress Test: Informed Consent Details: Physician/Practitioner Attestation; Transcribe to consent form and obtain patient signature  ? MYOCARDIAL PERFUSION IMAGING  ? EKG 12-Lead  ? VAS US CAROTID  ? VAS [place] ABI WITH/WO TBI  ? VAS [place] LOWER EXTREMITY ARTERIAL DUPLEX  ? ?Meds ordered this encounter  ?Medications  ? hydrochlorothiazide (MICROZIDE) 12.5 MG capsule  ?  Sig: Take 1 capsule (12.5 mg total) by mouth daily.  ?  Dispense:  90 capsule  ?  Refill:  3  ? ? ?Patient Instructions  ?Medication Instructions:  ?Your physician has recommended you make the following change in your medication:  ?START: Aspirin 81 mg by mouth once daily ? ?START: hydrochlorothiazide (HCTZ) 12.5 mg by mouth once daily  ?*If you need a refill on your cardiac medications before your next appointment, please call your  pharmacy* ? ? ?Lab Work: ?IN 1 WEEK: BMP ?If you have labs (blood work) drawn today and your tests are completely normal, you will receive your results only by: ?MyChart Message (if you have MyChart) OR ?A pap

## 2021-08-28 ENCOUNTER — Ambulatory Visit: Payer: Medicare HMO | Admitting: Internal Medicine

## 2021-08-28 ENCOUNTER — Encounter: Payer: Self-pay | Admitting: Internal Medicine

## 2021-08-28 VITALS — BP 142/88 | HR 76 | Ht 71.0 in | Wt 200.0 lb

## 2021-08-28 DIAGNOSIS — I739 Peripheral vascular disease, unspecified: Secondary | ICD-10-CM | POA: Insufficient documentation

## 2021-08-28 DIAGNOSIS — Z720 Tobacco use: Secondary | ICD-10-CM | POA: Diagnosis not present

## 2021-08-28 DIAGNOSIS — R0989 Other specified symptoms and signs involving the circulatory and respiratory systems: Secondary | ICD-10-CM | POA: Diagnosis not present

## 2021-08-28 DIAGNOSIS — I152 Hypertension secondary to endocrine disorders: Secondary | ICD-10-CM

## 2021-08-28 DIAGNOSIS — E1159 Type 2 diabetes mellitus with other circulatory complications: Secondary | ICD-10-CM

## 2021-08-28 DIAGNOSIS — R079 Chest pain, unspecified: Secondary | ICD-10-CM | POA: Diagnosis not present

## 2021-08-28 MED ORDER — HYDROCHLOROTHIAZIDE 12.5 MG PO CAPS: 12.5000 mg | ORAL_CAPSULE | Freq: Every day | ORAL | 3 refills | Status: DC

## 2021-08-28 NOTE — Patient Instructions (Signed)
Medication Instructions:  ?Your physician has recommended you make the following change in your medication:  ?START: Aspirin 81 mg by mouth once daily ? ?START: hydrochlorothiazide (HCTZ) 12.5 mg by mouth once daily  ?*If you need a refill on your cardiac medications before your next appointment, please call your pharmacy* ? ? ?Lab Work: ?IN 1 WEEK: BMP ?If you have labs (blood work) drawn today and your tests are completely normal, you will receive your results only by: ?MyChart Message (if you have MyChart) OR ?A paper copy in the mail ?If you have any lab test that is abnormal or we need to change your treatment, we will call you to review the results. ? ? ?Testing/Procedures: ?Your physician has requested that you have a right carotid duplex. This test is an ultrasound of the carotid arteries in your neck. It looks at blood flow through these arteries that supply the brain with blood.  There are no restrictions or special instructions.  ? ?Your physician has requested that you have an ankle brachial index (ABI). During this test an ultrasound and blood pressure cuff are used to evaluate the arteries that supply the arms and legs with blood. Allow thirty minutes for this exam. There are no restrictions or special instructions. ?Your physician has requested that you have a lower extremity arterial duplex. This test is an ultrasound of the arteries in the legs. It looks at arterial blood flow in the legs. There are no restrictions or special instructions. ? ?Your physician has requested that you have a lexiscan myoview. For further information please visit [email or web address]. Please follow instruction sheet, as given.  ? ?You are scheduled for a Myocardial Perfusion Imaging Study. ?Please arrive 15 minutes prior to your appointment time for registration and insurance purposes. ?  ?The test will take approximately 3 to 4 hours to complete; you may bring reading material.  If someone comes with you to your  appointment, they will need to remain in the main lobby due to limited space in the testing area.  ?  ?How to prepare for your Myocardial Perfusion Test: ?Do not eat or drink 3 hours prior to your test, except you may have water. ?Do not consume products containing caffeine (regular or decaffeinated) 12 hours prior to your test. (ex: coffee, chocolate, sodas, tea). ?Do bring a list of your current medications with you.  If not listed below, you may take your medications as normal. ?Do wear comfortable clothes (no dresses or overalls) and walking shoes, tennis shoes preferred (No heels or open toe shoes are allowed). ?Do NOT wear cologne, perfume, aftershave, or lotions (deodorant is allowed). ?If these instructions are not followed, your test will have to be rescheduled. ? ?If you cannot keep your appointment, please provide 24 hours notification to the Nuclear Lab, to avoid a possible $50 charge to your account.  ?   ? ?Follow-Up: ?At [facility], you and your health needs are our priority.  As part of our continuing mission to provide you with exceptional heart care, we have created designated Provider Care Teams.  These Care Teams include your primary Cardiologist (physician) and Advanced Practice Providers (APPs -  Physician Assistants and Nurse Practitioners) who all work together to provide you with the care you need, when you need it. ? ? ?Your next appointment:   ?6 month(s) ? ?The format for your next appointment:   ?In Person ? ?Provider:   ?Werner Lean, MD   ? ? ?Important Information About Sugar ? ? ? ? ?  ?

## 2021-09-05 ENCOUNTER — Telehealth (HOSPITAL_COMMUNITY): Payer: Self-pay

## 2021-09-05 NOTE — Telephone Encounter (Signed)
Spoke with the patient, detailed instructions were given. He stated that he understood and would be here for his test. Asked to call back with any questions. S.Novalie Leamy EMtP

## 2021-09-13 ENCOUNTER — Ambulatory Visit (HOSPITAL_COMMUNITY): Payer: Medicare HMO | Attending: Internal Medicine

## 2021-09-13 ENCOUNTER — Other Ambulatory Visit: Payer: Medicare HMO | Admitting: *Deleted

## 2021-09-13 DIAGNOSIS — R079 Chest pain, unspecified: Secondary | ICD-10-CM

## 2021-09-13 LAB — MYOCARDIAL PERFUSION IMAGING
LV dias vol: 60 mL (ref 62–150)
LV sys vol: 20 mL
Nuc Stress EF: 66 %
Peak HR: 98 {beats}/min
Rest HR: 77 {beats}/min
Rest Nuclear Isotope Dose: 10.1 mCi
SDS: 0
SRS: 0
SSS: 0
ST Depression (mm): 0 mm
Stress Nuclear Isotope Dose: 32.7 mCi
TID: 1.11

## 2021-09-13 LAB — BASIC METABOLIC PANEL
BUN/Creatinine Ratio: 17 (ref 10–24)
BUN: 15 mg/dL (ref 8–27)
CO2: 25 mmol/L (ref 20–29)
Calcium: 9.8 mg/dL (ref 8.6–10.2)
Chloride: 97 mmol/L (ref 96–106)
Creatinine, Ser: 0.87 mg/dL (ref 0.76–1.27)
Glucose: 184 mg/dL — ABNORMAL HIGH (ref 70–99)
Potassium: 4.8 mmol/L (ref 3.5–5.2)
Sodium: 134 mmol/L (ref 134–144)
eGFR: 93 mL/min/{1.73_m2} (ref >59)

## 2021-09-13 MED ORDER — REGADENOSON 0.4 MG/5ML IV SOLN
0.4000 mg | Freq: Once | INTRAVENOUS | Status: AC
  Administered 2021-09-13: 0.4 mg via INTRAVENOUS

## 2021-09-13 MED ORDER — TECHNETIUM TC 99M TETROFOSMIN IV KIT
32.7000 | PACK | Freq: Once | INTRAVENOUS | Status: AC | PRN
  Administered 2021-09-13: 32.7 via INTRAVENOUS

## 2021-09-13 MED ORDER — TECHNETIUM TC 99M TETROFOSMIN IV KIT
10.1000 | PACK | Freq: Once | INTRAVENOUS | Status: AC | PRN
  Administered 2021-09-13: 10.1 via INTRAVENOUS

## 2021-09-19 ENCOUNTER — Ambulatory Visit (HOSPITAL_COMMUNITY)
Admission: RE | Admit: 2021-09-19 | Discharge: 2021-09-19 | Disposition: A | Discharge status: Home / Self Care | Payer: Medicare HMO | Source: Ambulatory Visit | Attending: Internal Medicine | Admitting: Internal Medicine

## 2021-09-19 ENCOUNTER — Ambulatory Visit (HOSPITAL_BASED_OUTPATIENT_CLINIC_OR_DEPARTMENT_OTHER)
Admission: RE | Admit: 2021-09-19 | Discharge: 2021-09-19 | Disposition: A | Discharge status: Home / Self Care | Payer: Medicare HMO | Source: Ambulatory Visit | Attending: Internal Medicine | Admitting: Internal Medicine

## 2021-09-19 DIAGNOSIS — I739 Peripheral vascular disease, unspecified: Secondary | ICD-10-CM

## 2021-09-19 DIAGNOSIS — R0989 Other specified symptoms and signs involving the circulatory and respiratory systems: Secondary | ICD-10-CM

## 2021-09-25 ENCOUNTER — Other Ambulatory Visit: Payer: Self-pay

## 2021-09-25 MED ORDER — HYDROCHLOROTHIAZIDE 12.5 MG PO CAPS: 12.5000 mg | ORAL_CAPSULE | Freq: Every day | ORAL | 3 refills | Status: DC

## 2021-11-12 ENCOUNTER — Telehealth: Payer: Self-pay | Admitting: Internal Medicine

## 2021-11-12 DIAGNOSIS — I152 Hypertension secondary to endocrine disorders: Secondary | ICD-10-CM

## 2021-11-12 NOTE — Telephone Encounter (Signed)
Pt c/o medication issue:  1. Name of Medication: Hydrochlorothiazide  2. How are you currently taking this medication (dosage and times per day)? 1 tablet daily  3. Are you having a reaction (difficulty breathing--STAT)?   4. What is your medication issue? Was having stomach issues- stopped taking it 2 days ago. He is feeling better already

## 2021-11-12 NOTE — Telephone Encounter (Signed)
Called pt in regards to stomach issues r/t HCTZ.  Pt reports has stomach problems and previously could not take amlodipine d/t stomach problems.  Reports lower abdominal pain and excessive bloating.  Since stopping HCTZ 2 days ago symptoms have improved.  BP prior to starting HCTZ 150's/80's after starting 130-140's/ 80's.  Denies leg edema.   Pt expresses wife and brother in law are both taking lisinopril with no issues.  Pt wants to know if med would be okay for him.  Will route to MD to address.

## 2021-11-14 NOTE — Telephone Encounter (Signed)
Called pt advised of MD recommendation.  Order placed for HTN clinic.  Advised pt someone will call to schedule OV.  Pt had no questions or concerns.

## 2021-12-11 NOTE — Progress Notes (Unsigned)
Patient ID: Mark Travis                 DOB: [birth year]                      MRN: [id number]      HPI: Mark Travis is a 71 y.o. male referred by Dr. Gasper Sells to HTN clinic. PMH is significant for HTN, T2DM, NAFLD, emphysema, glaucoma, sleep apnea, claudication of both lower extremities.  Pt seen by Dr. Gasper Sells on 08/28/21, BP was 142/88, pt reported stopping amlodipine due to abdominal pain with mild improvement off of amlodipine, hydrochlorothiazide 12.5 mg daily was added. F/u BMET about 2 weeks later was stable. Pt called clinic 11/12/21 reporting lower abdominal pain and excessive bloating with hctz which improved after stopping. Pt was referred to HTN clinic for further management.   Pt presents to HTN clinic today. Pt reports that while has has been experiencing diarrhea and bloating this has been a longstanding issues. 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 He says that he is still taking hctz and it is not bothering him. His stomach issues have not gotten any worse since starting hctz, they are just always there. Patient takes ibuprofen 800 mg only about once a month due to partially herniated disc at C4. His neck pain also limits his physical activity but he does walk up and down the stairs throughout the day and tries to get walking in daily. He denies any dizziness, rarely gets headaches, and denies changes in his vision. Pt has not yet started reducing number of cigarettes each day. He did quit for about a year in the past but then started smoking again. He also tried NRT without success in the past. He had been using hypnosis tapes which were effective in the past but he started smoking again with his neck pain. He tries to avoid adding salt to food and avoids canned foods. He eats cereal with fruit and almond milk for breakfast or eggs with sausage or bacon. For lunch he will have eggs if he did not have cereal for breakfast, but occasionally gets Wendy's or [place] [employer]  without bread for lunch. For dinner he typically eats burgers, pasta or salmon. Pt drinks water but drinks 3 cups of coffee throughout the day.   Current HTN meds: losartan 100 mg daily, hctz 12.5 mg daily  Previously tried: amlodipine (abdominal pain) BP goal: <130/80  Family History: The patient's family history includes Alcoholism in his maternal grandmother; Arthritis in his mother; Heart disease in his mother; Hypertension in his mother.  Social History: quit date set for 6 months from 08/28/21, plan to wean from PPD to 2 packs per week - pt says that is still smoking, minimal alcohol   Diet:  Adds a little bit of salt to food when he cooks but doesn't eat salty food Avoids canned soups Eats fresh veggies Breakfast- cereal with fruit with almond milk no sugar added or eggs with small sausage patty or a few strips of bacon Lunch-[employer] on occasion (once a month), [place] [employer] sub without the bread, or eggs for lunch if had cereal for breakfast Dinner- burgers, pasta, salmon  Drinks water, black coffee 3 cups   Exercise:  Walks up and down the stairs throughout the day Has partially herniated disc at C4 so limited with activity Stretches every day   Home BP readings:  135/145 to 75/85 Not sitting down and resting prior  Wt Readings from Last 3 Encounters:  09/13/21 200 lb (90.7 kg)  08/28/21 200 lb (90.7 kg)  03/04/19 190 lb 9.6 oz (86.5 kg)   BP Readings from Last 3 Encounters:  12/12/21 132/70  08/28/21 (!) 142/88  03/04/19 134/82   Pulse Readings from Last 3 Encounters:  12/12/21 92  08/28/21 76  03/04/19 72    Renal function: CrCl cannot be calculated (Patient's most recent lab result is older than the maximum 21 days allowed.).  Past Medical History:  Diagnosis Date   Chronic low back pain    Decreased hearing    Diverticulosis    Emphysema (subcutaneous) (surgical) resulting from a procedure    Fatty liver    Glaucoma    Hiatal hernia    IBS  (irritable bowel syndrome)    OSA on CPAP    Prostatitis    Seasonal allergies     Current Outpatient Medications on File Prior to Visit  Medication Sig Dispense Refill   famotidine (PEPCID) 40 MG tablet Take 1 tablet by mouth 2 (two) times daily.     lipase/protease/amylase (CREON) 36000 UNITS CPEP capsule Take by mouth.     b complex vitamins capsule Take 1 capsule by mouth daily.     dorzolamide-timolol (COSOPT) 22.3-6.8 MG/ML ophthalmic solution      doxycycline (VIBRAMYCIN) 100 MG capsule Take 1 capsule by mouth 2 (two) times daily.     ECHINACEA PO Take 1 tablet by mouth daily.     ELDERBERRY PO Take 1 tablet by mouth daily.     fluocinonide ointment (LIDEX) 5.83 % Apply 1 Application topically 4 (four) times daily.     fluticasone (FLONASE) 50 MCG/ACT nasal spray Place into both nostrils.     gabapentin (NEURONTIN) 300 MG capsule Take 300 mg by mouth as needed.     hydrochlorothiazide (MICROZIDE) 12.5 MG capsule Take 1 capsule (12.5 mg total) by mouth daily. 90 capsule 3   hydrOXYzine (ATARAX) 10 MG tablet TAKE 1 TO 2 TABLETS EVERY 4 TO 6 HOURS AS NEEDED FOR ITCHING     ibuprofen (ADVIL) 800 MG tablet as needed.     latanoprost (XALATAN) 0.005 % ophthalmic solution      losartan (COZAAR) 100 MG tablet Take 100 mg by mouth daily.     metFORMIN (GLUCOPHAGE-XR) 500 MG 24 hr tablet 1 tablet 2 (two) times a day.     Omega-3 Fatty Acids (OMEGA-3 FISH OIL PO) Take by mouth.     omeprazole (PRILOSEC) 40 MG capsule TAKE 1 CAPSULE BY MOUTH EVERY DAY IN THE MORNING BEFORE BREAKFAST     REPATHA SURECLICK 094 MG/ML SOAJ 076 mg. Every 2 weeks     Saw Palmetto, Serenoa repens, (SAW PALMETTO PO) Take 1 tablet by mouth daily.     SODIUM FLUORIDE 5000 SENSITIVE 1.1-5 % GEL Take by mouth at bedtime.     tamsulosin (FLOMAX) 0.4 MG CAPS capsule Take by mouth.     tiZANidine (ZANAFLEX) 4 MG tablet Take 1 tablet by mouth at bedtime as needed.     VITAMIN A PO Take 1 tablet by mouth daily.     VITAMIN  D PO Take 1 tablet by mouth daily.     VITAMIN E PO Take 1 tablet by mouth daily.     zinc gluconate 50 MG tablet Take by mouth daily.     No current facility-administered medications on file prior to visit.    Allergies  Allergen Reactions   Brimonidine Tartrate-Timolol  Other reaction(s): Other crusting crusting   Cinnamon     Other reaction(s): Other Testing showed allergy to it  Testing showed allergy to it     Statins     Other reaction(s): Other Muscle cramps Muscle cramps    Blood pressure 132/70, pulse 92, SpO2 97 %.   Assessment/Plan:  1. Hypertension - BP is 132/70 is close to goal over <130/80. Discussed options of working on changes with diet and exercise versus changing losartan to a more potent ARB such as valsartan or irbesartan at equivalent dose to get BP to goal. Pt would prefer to address diet and exercise changes at this time. Will have patient continue taking losartan 100 mg daily and hctz 12.5 mg daily. Instructed patient on proper BP technique at home, recommended taking blood pressure daily 1 hr after BP medications and writing down readings. Encouraged pt to limit fast food and limit added salt to food and to try and reduce coffee from 3 cups to 2 cups a day. Pt also encouraged to listen to hypnosis tapes for smoking cessation. Will call patient in 1 month to check on home blood pressures and address any medication changes at this time if needed.   Thank you  Eliseo Gum, PharmD PGY1 Pharmacy Resident   Seen by PGY1 resident with myself in room and agree to plan  Karren Cobble, PharmD, BCACP, [place], [place] [address] [place], [place] [address] Phone: [phone]; Fax: [phone] 12/12/2021 3:45 PM   12/12/2021  3:37 PM

## 2021-12-12 ENCOUNTER — Ambulatory Visit: Payer: Medicare HMO | Attending: Cardiology | Admitting: Pharmacist

## 2021-12-12 ENCOUNTER — Encounter: Payer: Self-pay | Admitting: Pharmacist

## 2021-12-12 VITALS — BP 132/70 | HR 92

## 2021-12-12 DIAGNOSIS — I152 Hypertension secondary to endocrine disorders: Secondary | ICD-10-CM

## 2021-12-12 DIAGNOSIS — Z720 Tobacco use: Secondary | ICD-10-CM | POA: Diagnosis not present

## 2021-12-12 DIAGNOSIS — E1159 Type 2 diabetes mellitus with other circulatory complications: Secondary | ICD-10-CM | POA: Diagnosis not present

## 2021-12-12 NOTE — Patient Instructions (Signed)
Your blood pressure today was 132/70. We would like it to be <130/80.   Continue to take losartan 100 mg daily and hctz 12.5 mg daily.  Start drinking 2 cups of coffee a day instead of 3. Try to increase your walking throughout the day as you are able, with the goal of 150 minutes/week of activity.   Try to limit fast food and adding salt to food.  We will give you a call in about a month to check on your home blood pressures. Remember when you are taking your blood pressure at home to sit for 5 minutes with legs uncrossed and have arm resting at heart level on the table.   Try listening to your hypnosis tapes occasionally.

## 2022-02-20 ENCOUNTER — Telehealth: Payer: Self-pay | Admitting: Genetic Counselor

## 2022-02-20 NOTE — Telephone Encounter (Signed)
Scheduled appointment per 11/07 referral. Patient is aware of appointment date and time. Patient is aware to arrive 15 mins prior to appointment time and to bring updated insurance cards. Patient is aware of location.   

## 2022-03-14 NOTE — Progress Notes (Signed)
Cardiology Office Note:    Date:  03/15/2022   ID:  Mark Travis, DOB 1950/05/11, MRN [id number]  PCP:  Heywood Bene, PA-C   [facility] Providers Cardiologist:  Werner Lean, MD     Referring MD: Heywood Bene, *   CC: HTN f/u  History of Present Illness:    Mark Travis is a 71 y.o. male with a hx of HTN, DM, with difficult to control BP.  NAFLD and atorvastatin myalgia on repatha (LDL 37), stopped amlodipine in the based because of abdominal pain with mild improvement off change in amlodipine, active tobacco use.  Seen 08/28/21. 2023: Say HTN clinc and BP was near goal.  Negative stress test. No PAD or CAS on imaging.  Patient notes that he is doing well.   Since last visit notes that he did ten days of ciprofloxacin; has a vasovagal reaction.  This resolved off therapy. There are no interval hospital/ED visit.    No chest pain or pressure .  No SOB/DOE and no PND/Orthopnea.  No weight gain or leg swelling.  No palpitations or syncope .  Ambulatory blood pressure 120/80 outside of taking cipro.    Past Medical History:  Diagnosis Date   Chronic low back pain    Decreased hearing    Diverticulosis    Emphysema (subcutaneous) (surgical) resulting from a procedure    Fatty liver    Glaucoma    Hiatal hernia    IBS (irritable bowel syndrome)    OSA on CPAP    Prostatitis    Seasonal allergies     Past Surgical History:  Procedure Laterality Date   COLONOSCOPY  10/2012   L trigger finger release      Current Medications: Current Meds  Medication Sig   b complex vitamins capsule Take 1 capsule by mouth daily.   dorzolamide-timolol (COSOPT) 22.3-6.8 MG/ML ophthalmic solution in the morning and at bedtime.   famotidine (PEPCID) 40 MG tablet Take 1 tablet by mouth 2 (two) times daily.   fexofenadine (ALLEGRA) 180 MG tablet as needed for allergies.   fluticasone (FLONASE) 50 MCG/ACT nasal spray Place into both nostrils as needed for  allergies.   gabapentin (NEURONTIN) 300 MG capsule Take 300 mg by mouth as needed.   hydrochlorothiazide (MICROZIDE) 12.5 MG capsule Take 1 capsule (12.5 mg total) by mouth daily.   ibuprofen (ADVIL) 800 MG tablet as needed.   latanoprost (XALATAN) 0.005 % ophthalmic solution daily at 6 (six) AM.   losartan (COZAAR) 100 MG tablet Take 100 mg by mouth daily.   metFORMIN (GLUCOPHAGE-XR) 500 MG 24 hr tablet 1 tablet 2 (two) times a day.   Omega-3 Fatty Acids (OMEGA-3 FISH OIL PO) Take by mouth daily at 6 (six) AM.   REPATHA SURECLICK 408 MG/ML SOAJ 144 mg every 30 (thirty) days. Every 2 weeks   SODIUM FLUORIDE 5000 SENSITIVE 1.1-5 % GEL Take by mouth at bedtime.   tiZANidine (ZANAFLEX) 4 MG tablet Take 1 tablet by mouth at bedtime as needed.   VITAMIN A PO Take 1 tablet by mouth daily.   VITAMIN D PO Take 1 tablet by mouth daily.   VITAMIN E PO Take 1 tablet by mouth daily.   zinc gluconate 50 MG tablet Take by mouth daily.     Allergies:   Brimonidine tartrate-timolol, Cinnamon, and Statins   Social History   Socioeconomic History   Marital status: Married    Spouse name: Not on file   Number of  children: Not on file   Years of education: Not on file   Highest education level: Not on file  Occupational History   Not on file  Tobacco Use   Smoking status: Former    Packs/day: 1.00    Years: 34.00    Total pack years: 34.00    Types: Cigarettes   Smokeless tobacco: Never  Substance and Sexual Activity   Alcohol use: Yes   Drug use: Not Currently   Sexual activity: Not on file  Other Topics Concern   Not on file  Social History Narrative   Not on file   Social Determinants of Health   Financial Resource Strain: Not on file  Food Insecurity: Not on file  Transportation Needs: Not on file  Physical Activity: Not on file  Stress: Not on file  Social Connections: Not on file    Social: Has a daugther who went to [place]  Family History: The patient's family history  includes Alcoholism in his maternal grandmother; Arthritis in his mother; Heart disease in his mother; Hypertension in his mother.  ROS:   Please see the history of present illness.    All other systems reviewed and are negative.  EKGs/Labs/Other Studies Reviewed:    The following studies were reviewed today:  EKG:   08/28/21: SR rate 76   Lung Screening CT: Date: 05/25/21 Results: LM CAC & Aortic atherosclerosis Mild Right brachiocephalic stenosis < 89% non con  Cardiac Studies & Procedures     STRESS TESTS  MYOCARDIAL PERFUSION IMAGING 09/13/2021  Narrative   The study is normal. The study is low risk.   No ST deviation was noted.   Left ventricular function is normal. Nuclear stress EF: 66 %. The left ventricular ejection fraction is hyperdynamic (>65%). End diastolic cavity size is normal.   Prior study not available for comparison.               Recent Labs: 09/13/2021: BUN 15; Creatinine, Ser 0.87; Potassium 4.8; Sodium 134  Recent Lipid Panel No results found for: "CHOL", "TRIG", "HDL", "CHOLHDL", "VLDL", "LDLCALC", "LDLDIRECT"       Physical Exam:    VS:  BP 120/72   Pulse 80   Ht '5\' 11"'$  (1.803 m)   Wt 196 lb (88.9 kg)   SpO2 98%   BMI 27.34 kg/m     Wt Readings from Last 3 Encounters:  03/15/22 196 lb (88.9 kg)  09/13/21 200 lb (90.7 kg)  08/28/21 200 lb (90.7 kg)    Gen: no distress  Neck: No JVD Ears: Bilateral Pilar Plate Sign Cardiac: No Rubs or Gallops, no murmur, RRR +2 radial pulses Respiratory: Clear to auscultation bilaterally, normal effort, normal  respiratory rate GI: Soft, nontender, non-distended  MS: No  edema;  moves all extremities Integument: Skin feels warm, decreased hair in patches with shiny appearance (baseline) Neuro:  At time of evaluation, alert and oriented to person/place/time/situation  Psych: Normal affect, patient feels   ASSESSMENT:    1. Coronary artery calcification   2. OSA and COPD overlap syndrome ([place])   3.  Tobacco abuse   4. Hypertension associated with diabetes ([place])   5. Mixed hyperlipidemia   6. Myalgia due to statin   7. Aortic atherosclerosis ([facility])     PLAN:    CAC Aortic atherosclerosis Atorvastatin myalgia - ASA 81 mg PO daily  (restart) - negative stress test no sx - LDL 37 on repatha  HTN with DM - he has concern for novasc  related abdominal pain - contine losartan 100 mg PO daily - HCTZ 12.5 mg PO daily  COPD/OSA overlap Tobacco abuse - New quit date 05/16/22 - He felt crazy with Wellbutrin and Chantix - he found his old hypnosis tapes and will try this   One year with me          Medication Adjustments/Labs and Tests Ordered: Current medicines are reviewed at length with the patient today.  Concerns regarding medicines are outlined above.  No orders of the defined types were placed in this encounter.  No orders of the defined types were placed in this encounter.   Patient Instructions  Medication Instructions:  Your physician recommends that you continue on your current medications as directed. Please refer to the Current Medication list given to you today. RESTART: Aspirin 81 mg by mouth once daily  *If you need a refill on your cardiac medications before your next appointment, please call your pharmacy*   Lab Work: NONE If you have labs (blood work) drawn today and your tests are completely normal, you will receive your results only by: [place] (if you have MyChart) OR A paper copy in the mail If you have any lab test that is abnormal or we need to change your treatment, we will call you to review the results.   Testing/Procedures: NONE   Follow-Up: At [facility], you and your health needs are our priority.  As part of our continuing mission to provide you with exceptional heart care, we have created designated Provider Care Teams.  These Care Teams include your primary Cardiologist (physician) and Advanced Practice Providers  (APPs -  Physician Assistants and Nurse Practitioners) who all work together to provide you with the care you need, when you need it.   Your next appointment:   1 year(s)  The format for your next appointment:   In Person  Provider:   Werner Lean, MD     Important Information About Sugar         Signed, Werner Lean, MD  03/15/2022 8:27 AM    [place]

## 2022-03-15 ENCOUNTER — Ambulatory Visit: Payer: Medicare HMO | Attending: Internal Medicine | Admitting: Internal Medicine

## 2022-03-15 ENCOUNTER — Encounter: Payer: Self-pay | Admitting: Internal Medicine

## 2022-03-15 VITALS — BP 120/72 | HR 80 | Ht 71.0 in | Wt 196.0 lb

## 2022-03-15 DIAGNOSIS — J449 Chronic obstructive pulmonary disease, unspecified: Secondary | ICD-10-CM

## 2022-03-15 DIAGNOSIS — M791 Myalgia, unspecified site: Secondary | ICD-10-CM

## 2022-03-15 DIAGNOSIS — Z720 Tobacco use: Secondary | ICD-10-CM | POA: Diagnosis not present

## 2022-03-15 DIAGNOSIS — I700 Atherosclerosis of aorta: Secondary | ICD-10-CM

## 2022-03-15 DIAGNOSIS — I152 Hypertension secondary to endocrine disorders: Secondary | ICD-10-CM

## 2022-03-15 DIAGNOSIS — E1159 Type 2 diabetes mellitus with other circulatory complications: Secondary | ICD-10-CM | POA: Diagnosis not present

## 2022-03-15 DIAGNOSIS — E782 Mixed hyperlipidemia: Secondary | ICD-10-CM

## 2022-03-15 DIAGNOSIS — T466X5A Adverse effect of antihyperlipidemic and antiarteriosclerotic drugs, initial encounter: Secondary | ICD-10-CM | POA: Insufficient documentation

## 2022-03-15 DIAGNOSIS — G4733 Obstructive sleep apnea (adult) (pediatric): Secondary | ICD-10-CM | POA: Diagnosis not present

## 2022-03-15 DIAGNOSIS — I2584 Coronary atherosclerosis due to calcified coronary lesion: Secondary | ICD-10-CM

## 2022-03-15 DIAGNOSIS — I2510 Atherosclerotic heart disease of native coronary artery without angina pectoris: Secondary | ICD-10-CM | POA: Diagnosis not present

## 2022-03-15 NOTE — Patient Instructions (Signed)
Medication Instructions:  Your physician recommends that you continue on your current medications as directed. Please refer to the Current Medication list given to you today. RESTART: Aspirin 81 mg by mouth once daily  *If you need a refill on your cardiac medications before your next appointment, please call your pharmacy*   Lab Work: NONE If you have labs (blood work) drawn today and your tests are completely normal, you will receive your results only by: [place] (if you have MyChart) OR A paper copy in the mail If you have any lab test that is abnormal or we need to change your treatment, we will call you to review the results.   Testing/Procedures: NONE   Follow-Up: At [facility], you and your health needs are our priority.  As part of our continuing mission to provide you with exceptional heart care, we have created designated Provider Care Teams.  These Care Teams include your primary Cardiologist (physician) and Advanced Practice Providers (APPs -  Physician Assistants and Nurse Practitioners) who all work together to provide you with the care you need, when you need it.   Your next appointment:   1 year(s)  The format for your next appointment:   In Person  Provider:   Werner Lean, MD     Important Information About Sugar

## 2022-03-25 ENCOUNTER — Other Ambulatory Visit: Payer: Self-pay | Admitting: Genetic Counselor

## 2022-03-25 DIAGNOSIS — C61 Malignant neoplasm of prostate: Secondary | ICD-10-CM

## 2022-03-26 ENCOUNTER — Inpatient Hospital Stay: Payer: Medicare HMO

## 2022-03-26 ENCOUNTER — Inpatient Hospital Stay: Payer: Medicare HMO | Attending: Genetic Counselor | Admitting: Genetic Counselor

## 2022-03-26 ENCOUNTER — Other Ambulatory Visit: Payer: Self-pay

## 2022-03-26 ENCOUNTER — Encounter: Payer: Self-pay | Admitting: Genetic Counselor

## 2022-03-26 DIAGNOSIS — C61 Malignant neoplasm of prostate: Secondary | ICD-10-CM

## 2022-03-26 DIAGNOSIS — Z809 Family history of malignant neoplasm, unspecified: Secondary | ICD-10-CM

## 2022-03-26 DIAGNOSIS — Z7183 Encounter for nonprocreative genetic counseling: Secondary | ICD-10-CM | POA: Diagnosis not present

## 2022-03-26 LAB — GENETIC SCREENING ORDER

## 2022-03-26 NOTE — Progress Notes (Signed)
REFERRING PROVIDER: Heywood Bene, PA-C [address] [place] [address] [place],  [place] [address]  PRIMARY PROVIDER:  Heywood Bene, PA-C  PRIMARY REASON FOR VISIT:  1. Family history of cancer      HISTORY OF PRESENT ILLNESS:   Mr. Luczak, a 71 y.o. male, was seen for a [place] cancer genetics consultation at the request of Dr. Jimmye Norman due to a family history of cancer.  Mr. Alvira presents to clinic today to discuss the possibility of a hereditary predisposition to cancer, genetic testing, and to further clarify his future cancer risks, as well as potential cancer risks for family members.   Mr. Egolf is a 71 y.o. male with no personal history of cancer.  He has benign prostate issues causing his PSA to elevate.  Otherwise he is healthy.  CANCER HISTORY:  Oncology History   No history exists.    Past Medical History:  Diagnosis Date   Chronic low back pain    Decreased hearing    Diverticulosis    Emphysema (subcutaneous) (surgical) resulting from a procedure    Family history of cancer    Fatty liver    Glaucoma    Hiatal hernia    IBS (irritable bowel syndrome)    OSA on CPAP    Prostatitis    Seasonal allergies     Past Surgical History:  Procedure Laterality Date   COLONOSCOPY  10/2012   L trigger finger release      Social History   Socioeconomic History   Marital status: Married    Spouse name: Not on file   Number of children: Not on file   Years of education: Not on file   Highest education level: Not on file  Occupational History   Not on file  Tobacco Use   Smoking status: Former    Packs/day: 1.00    Years: 34.00    Total pack years: 34.00    Types: Cigarettes   Smokeless tobacco: Never  Substance and Sexual Activity   Alcohol use: Yes   Drug use: Not Currently   Sexual activity: Not on file  Other Topics Concern   Not on file  Social History Narrative   Not on file   Social Determinants of Health   Financial Resource  Strain: Not on file  Food Insecurity: Not on file  Transportation Needs: Not on file  Physical Activity: Not on file  Stress: Not on file  Social Connections: Not on file     FAMILY HISTORY:  We obtained a detailed, 4-generation family history.  Significant diagnoses are listed below: Family History  Problem Relation Age of Onset   Arthritis Mother    Heart disease Mother    Hypertension Mother    Heart disease Maternal Grandmother    Alcoholism Maternal Grandmother    Cancer Maternal Grandfather 83       NOS   Heart disease Half-Sister        mat half   Cancer Cousin 96       mat 2nd cousin; NOS      The patient has one daughter who is cancer free.  He has a maternal half sister and a paternal half sister and brother who are cancer free.  Both parents are deceased.  The patient does not have any information on his father's side of the family.  His father died in his 64's.  The patient's mother died at 65 from heart disease.  She had a sister who  was cancer free.  The maternal grandfather died of an unknown cancer.  The grandmother died of heart disease and her sister's granddaughter died of an unknown cancer.  Mr. Sabine is unaware of previous family history of genetic testing for hereditary cancer risks. Patient's maternal ancestors are of Hungarian/[place] descent, and paternal ancestors are of [place] European descent. There is no reported Ashkenazi Jewish ancestry. There is no known consanguinity.  GENETIC COUNSELING ASSESSMENT: Mr. Eckardt is a 71 y.o. male with a family history of cancer which is somewhat suggestive of a sporadic or familial predisposition to cancer given the unknown status of the cancer. We, therefore, discussed and recommended the following at today's visit.   DISCUSSION: We discussed that, in general, most cancer is not inherited in families, but instead is sporadic or familial. Sporadic cancers occur by chance and typically happen at older ages  (>50 years) as this type of cancer is caused by genetic changes acquired during an individual's lifetime. Some families have more cancers than would be expected by chance; however, the ages or types of cancer are not consistent with a known genetic mutation or known genetic mutations have been ruled out. This type of familial cancer is thought to be due to a combination of multiple genetic, environmental, hormonal, and lifestyle factors. While this combination of factors likely increases the risk of cancer, the exact source of this risk is not currently identifiable or testable.  We discussed that 5 - 10% of cancer is hereditary, with each type of cancer having its own chance of being hereditary.  We reviewed the characteristics, features and inheritance patterns of hereditary cancer syndromes. We discussed with Mr. Ungaro that the family history does not meet insurance or NCCN criteria for genetic testing and, therefore, is not highly consistent with a familial hereditary cancer syndrome.  We feel he is at low risk to harbor a gene mutation associated with such a condition. However, Mr. Macqueen is concerned that he does not have information about the paternal side of his family. Therefore, we also discussed genetic testing, including the appropriate family members to test, the process of testing, insurance coverage and turn-around-time for results. We discussed the implications of a negative, positive, carrier and/or variant of uncertain significant result. Mr. Hornbaker  was offered a common hereditary cancer panel (47 genes) and an expanded pan-cancer panel (77 genes). Mr. Fish was informed of the benefits and limitations of each panel, including that expanded pan-cancer panels contain genes that do not have clear management guidelines at this point in time.  We also discussed that as the number of genes included on a panel increases, the chances of variants of uncertain significance increases. Mr. Ledin decided  to pursue genetic testing for the CancerNext-Expanded+RNAinsight gene panel.   The CancerNext-Expanded gene panel offered by [facility] and includes sequencing and rearrangement analysis for the following 77 genes: AIP, ALK, APC*, ATM*, AXIN2, BAP1, BARD1, BLM, BMPR1A, BRCA1*, BRCA2*, BRIP1*, CDC73, CDH1*, CDK4, CDKN1B, CDKN2A, CHEK2*, CTNNA1, DICER1, FANCC, FH, FLCN, GALNT12, KIF1B, LZTR1, MAX, MEN1, MET, MLH1*, MSH2*, MSH3, MSH6*, MUTYH*, NBN, NF1*, NF2, NTHL1, PALB2*, PHOX2B, PMS2*, POT1, PRKAR1A, PTCH1, PTEN*, RAD51C*, RAD51D*, RB1, RECQL, RET, SDHA, SDHAF2, SDHB, SDHC, SDHD, SMAD4, SMARCA4, SMARCB1, SMARCE1, STK11, SUFU, TMEM127, TP53*, TSC1, TSC2, VHL and XRCC2 (sequencing and deletion/duplication); EGFR, EGLN1, HOXB13, KIT, MITF, PDGFRA, POLD1, and POLE (sequencing only); EPCAM and GREM1 (deletion/duplication only). DNA and RNA analyses performed for * genes.   Based on Mr. Badal family history of cancer, he does  not meet medical criteria for genetic testing. We discussed that out of pocket testing would cost $250.  He agreed that he wanted to pursue testing, despite knowing that he will have this out of pocket cost.  We discussed that some people do not want to undergo genetic testing due to fear of genetic discrimination.  A federal law called the Genetic Information Non-Discrimination Act (GINA) of 2008 helps protect individuals against genetic discrimination based on their genetic test results.  It impacts both health insurance and employment.  With health insurance, it protects against increased premiums, being kicked off insurance or being forced to take a test in order to be insured.  For employment it protects against hiring, firing and promoting decisions based on genetic test results.  GINA does not apply to those in the [employer], those who work for companies with less than 15 employees, and new life insurance or long-term disability insurance policies.  Health status due to a cancer  diagnosis is not protected under GINA.   PLAN: After considering the risks, benefits, and limitations, Mr. Rihn provided informed consent to pursue genetic testing and the blood sample was sent to [facility] for analysis of the CancerNext-Expanded+RNAinsight panel. Results should be available within approximately 2-3 weeks' time, at which point they will be disclosed by telephone to Mr. Gaulin, as will any additional recommendations warranted by these results. Mr. Bender will receive a summary of his genetic counseling visit and a copy of his results once available. This information will also be available in Epic.   Lastly, we encouraged Mr. Duerst to remain in contact with cancer genetics annually so that we can continuously update the family history and inform him of any changes in cancer genetics and testing that may be of benefit for this family.   Mr. Micke questions were answered to his satisfaction today. Our contact information was provided should additional questions or concerns arise. Thank you for the referral and allowing [place] to share in the care of your patient.   Lorien Shingler P. Florene [place], [place], [facility] Licensed, Insurance risk surveyor [email or web address] phone: [phone]  The patient was seen for a total of 35 minutes in face-to-face genetic counseling.  The patient was seen alone.  Drs. Michell Heinrich, and/or [place] were available for questions, if needed..    _______________________________________________________________________ For Office Staff:  Number of people involved in session: 1 Was an Intern/ student involved with case: no

## 2022-03-28 ENCOUNTER — Other Ambulatory Visit: Payer: Self-pay | Admitting: Urology

## 2022-03-28 ENCOUNTER — Other Ambulatory Visit: Payer: Self-pay

## 2022-03-28 DIAGNOSIS — R972 Elevated prostate specific antigen [PSA]: Secondary | ICD-10-CM

## 2022-05-01 ENCOUNTER — Ambulatory Visit: Payer: Medicare HMO | Attending: Neurological Surgery | Admitting: Rehabilitative and Restorative Service Providers"

## 2022-05-01 ENCOUNTER — Encounter: Payer: Self-pay | Admitting: Rehabilitative and Restorative Service Providers"

## 2022-05-01 ENCOUNTER — Other Ambulatory Visit: Payer: Self-pay

## 2022-05-01 DIAGNOSIS — M47892 Other spondylosis, cervical region: Secondary | ICD-10-CM | POA: Diagnosis not present

## 2022-05-01 DIAGNOSIS — M79602 Pain in left arm: Secondary | ICD-10-CM | POA: Diagnosis not present

## 2022-05-01 DIAGNOSIS — R2689 Other abnormalities of gait and mobility: Secondary | ICD-10-CM | POA: Diagnosis not present

## 2022-05-01 DIAGNOSIS — M6281 Muscle weakness (generalized): Secondary | ICD-10-CM | POA: Insufficient documentation

## 2022-05-01 DIAGNOSIS — M542 Cervicalgia: Secondary | ICD-10-CM

## 2022-05-01 DIAGNOSIS — R252 Cramp and spasm: Secondary | ICD-10-CM | POA: Diagnosis not present

## 2022-05-01 NOTE — Therapy (Signed)
OUTPATIENT PHYSICAL THERAPY CERVICAL EVALUATION   Patient Name: Mark Travis MRN: [id number] DOB:12/23/1950, 72 y.o., male Today's Date: 05/01/2022  END OF SESSION:  PT End of Session - 05/01/22 0845     Visit Number 1    Date for PT Re-Evaluation 06/21/22    Authorization Type [employer]    Progress Note Due on Visit 10    PT Start Time 0840    PT Stop Time 0920    PT Time Calculation (min) 40 min    Activity Tolerance Patient tolerated treatment well    Behavior During Therapy WFL for tasks assessed/performed             Past Medical History:  Diagnosis Date   Chronic low back pain    Decreased hearing    Diverticulosis    Emphysema (subcutaneous) (surgical) resulting from a procedure    Family history of cancer    Fatty liver    Glaucoma    Hiatal hernia    IBS (irritable bowel syndrome)    OSA on CPAP    Prostatitis    Seasonal allergies    Past Surgical History:  Procedure Laterality Date   COLONOSCOPY  10/2012   L trigger finger release     Patient Active Problem List   Diagnosis Date Noted   Family history of cancer 03/26/2022   Myalgia due to statin 03/15/2022   Coronary artery calcification 03/15/2022   Aortic atherosclerosis ([place]) 03/15/2022   Chest pain of uncertain etiology 60/01/9322   Right carotid bruit 08/28/2021   Claudication of both lower extremities ([place]) 08/28/2021   Tobacco abuse 08/28/2021   Hypertension associated with diabetes ([place]) 08/28/2021   Allergic rhinitis due to animal (cat) (dog) hair and dander 08/07/2021   Allergic rhinitis due to pollen 08/07/2021   Chronic allergic conjunctivitis 08/07/2021   Chronic rhinitis 08/07/2021   Multiple thyroid nodules 05/09/2021   Nocturia 05/23/2020   Right knee pain 04/27/2020   Cervical radiculopathy 12/24/2019   Pulmonary nodule less than 6 cm determined by computed tomography of lung 11/25/2019   Hyperlipidemia 11/09/2019   Ptosis of right eyelid 05/27/2019   Deviated septum  03/31/2019   Type 2 diabetes mellitus ([place]) 03/23/2019   Chronic low back pain 03/19/2019   Fatty liver 03/19/2019   Other irritable bowel syndrome 03/19/2019   Other specified glaucoma 03/19/2019   Seasonal allergies 03/19/2019   Pulmonary emphysema ([place]) 12/09/2018   Loud snoring 12/09/2018   OSA and COPD overlap syndrome ([place]) 12/09/2018   Elevated prostate specific antigen (PSA) 05/27/2018   Patellofemoral syndrome of left knee 04/16/2017   Patellofemoral syndrome of right knee 04/16/2017   Primary open angle glaucoma of both eyes, moderate stage 11/11/2015   Cataract, nuclear, bilateral 09/18/2013   Degenerative progressive high myopia, bilateral 09/18/2013    PCP: Heywood Bene, PA-C  REFERRING PROVIDER: Kristeen Miss, MD  REFERRING DIAG: [phone] (ICD-10-CM) - Other spondylosis, cervical region  THERAPY DIAG:  Cervicalgia  Muscle weakness (generalized)  Cramp and spasm  Pain in left arm  Other abnormalities of gait and mobility  Rationale for Evaluation and Treatment: Rehabilitation  ONSET DATE: chronic, but worse the past year  SUBJECTIVE:  SUBJECTIVE STATEMENT: Pt reports that he has a cervical traction unit and it has been helping.  Patient was discharged from skilled PT back in January 2022 and states that PT had helped.  States that he had been doing his HEP and has changed his exercises some and added some of his own.  States that he had added some exercises and had developed some increased pain.  Pt went for a second opinion with Dr Ellene Route and he opted for patient to try PT again.  PERTINENT HISTORY:  Diabetes, Hypertension, Glaucoma, OA  PAIN:  Are you having pain? Yes: NPRS scale: 1-4/10 Pain location: cervical and radiating to left arm Pain  description: tingling Aggravating factors: sleeping, overhead activities Relieving factors: traction, thermal modalities  PRECAUTIONS: None  WEIGHT BEARING RESTRICTIONS: No  FALLS:  Has patient fallen in last 6 months? Yes. Number of falls 1 due to a vasovagal reaction when waking up and getting up to go to the bathroom  LIVING ENVIRONMENT: Lives with: lives with their spouse Lives in: House/apartment Stairs: Yes: Internal: 15 steps; on right going up Has following equipment at home:  has purchased a shower grab bar, but has not installed yet  OCCUPATION: part-time tax Press photographer, works primarily during tax season  PLOF: Independent and Leisure: used to enjoy boating, photography, reading, gaming on computer  PATIENT GOALS: To have more consistency on decreased pain level.  NEXT MD VISIT: July 2024  OBJECTIVE:   DIAGNOSTIC FINDINGS:  Older MRI and Cervical Radiographs in 2022, will likely need updated pending visit in July 2024.  PATIENT SURVEYS:  Eval:  FOTO 53% (projected 57% by visit 11)  COGNITION: Overall cognitive status: Within functional limits for tasks assessed  SENSATION: Eval:  reports tingling down left arm and occasionally left leg  POSTURE: rounded shoulders and forward head  PALPATION: Eval:  Mild tightness and spasms with cervical paraspinals and upper traps   CERVICAL ROM:   Active ROM A/PROM (deg) eval  Flexion 50  Extension 30  Right lateral flexion 20  Left lateral flexion 15  Right rotation 40  Left rotation 45   (Blank rows = not tested)  UPPER EXTREMITY ROM:  WFL  UPPER EXTREMITY MMT: Eval:   Right UE is 5/5, Left shoulder is 4+ to 5-/5 Right hip strength WFL, Left hip is 4 to 4+/5   FUNCTIONAL TESTS:  Eval: 5 times sit to stand: 14.9 sec Single Leg Stance:  Right- 4.7 sec, Left- 2.7 sec  TODAY'S TREATMENT:                                                                                                                               DATE: 05/01/2022  Reviewed HEP (see below)  PATIENT EDUCATION:  Education details: Issued HEP Person educated: Patient Education method: Explanation, Demonstration, and Handouts Education comprehension: verbalized understanding and returned demonstration  HOME EXERCISE PROGRAM: Access Code: [email or web address] URL: [email or web address] Date: 05/01/2022 Prepared by: Juel Burrow  Exercises - Seated Cervical Retraction  - 3 x daily - 7 x weekly - 1 sets - 10 reps - 3-5 sec hold - Seated Cervical Extension AROM  - 1 x daily - 7 x weekly - 2 sets - 10 reps - Seated Cervical Sidebending AROM  - 3 x daily - 7 x weekly - 1 sets - 3 reps - 20 hold - Seated Cervical Rotation AROM  - 3 x daily - 7 x weekly - 1 sets - 3 reps - 20 hold - Seated Correct Posture  - 1 x daily - 7 x weekly - 3 sets - 10 reps - Standing Row with Anchored Resistance  - 1 x daily - 7 x weekly - 3 sets - 10 reps - Single Arm Shoulder Extension with Anchored Resistance  - 1 x daily - 7 x weekly - 3 sets - 10 reps - Wall Push Up  - 1 x daily - 7 x weekly - 2 sets - 10 reps  ASSESSMENT:  CLINICAL IMPRESSION: Patient is a 72 y.o. male who was seen today for physical therapy evaluation and treatment for cervical spondylosis.  Pt has a long history of cervical pain and was seen in this clinic for PT in January 2022.  Patient states that he was doing better after PT in 2022, but has gradually had increased pain since.  Patient also reports that at times, he feels that his left leg has instability.  Patient presents with decreased standing balance, decreased left shoulder and hip strength, imbalance of posture, and increased pain in cervical region.  Pt reports at times, he has some pain in his back, as well.  Patient would benefit from skilled PT to progress towards his goal related activities to allow him to have decreased pain with activities.   OBJECTIVE IMPAIRMENTS: Abnormal gait, decreased balance, decreased  strength, increased muscle spasms, impaired flexibility, postural dysfunction, and pain.   ACTIVITY LIMITATIONS: carrying, lifting, and bending  PARTICIPATION LIMITATIONS: community activity  PERSONAL FACTORS: Past/current experiences, Time since onset of injury/illness/exacerbation, and 1-2 comorbidities: OA, Diabetes  are also affecting patient's functional outcome.   REHAB POTENTIAL: Good  CLINICAL DECISION MAKING: Evolving/moderate complexity  EVALUATION COMPLEXITY: Moderate   GOALS: Goals reviewed with patient? Yes  SHORT TERM GOALS: Target date: 05/17/2022  Pt will be independent with initial HEP. Baseline:  Goal status: INITIAL  2.  Patient will report at least 30% improvement in symptoms/decreased pain since starting therapy. Baseline: 4/10 Goal status: INITIAL   LONG TERM GOALS: Target date: 06/21/2022  Pt will be independent with advanced HEP and knowledge of independent progression. Baseline:  Goal status: INITIAL  2.  Patient will increase FOTO to at least 57% to demonstrate improvements in functional mobility. Baseline: 53% Goal status: INITIAL  3.  Patient will increase cervical A/ROM to [facility] to allow him to drive without any increased strain to neck. Baseline: see chart Goal status: INITIAL  4.  Patient will increase left shoulder and hip strength to at least 5-/5 throughout to allow him to lift objects without difficulty. Baseline: 4 to 4+/5 Goal status: INITIAL  5.  Patient will increase bilateral single leg stance to at least 20 seconds to decrease risk of falling. Baseline: less than 5 sec on each side Goal status: INITIAL   PLAN:  PT FREQUENCY: 2x/week  PT DURATION: 8 weeks  PLANNED INTERVENTIONS: Therapeutic exercises, Therapeutic activity, Neuromuscular re-education, Balance training, Gait training, Patient/Family education, Self Care, Joint mobilization, Joint manipulation, Aquatic Therapy,  Dry Needling, Electrical stimulation, Spinal  manipulation, Spinal mobilization, Cryotherapy, Moist heat, Taping, Traction, Ultrasound, Ionotophoresis '4mg'$ /ml Dexamethasone, Manual therapy, and Re-evaluation  PLAN FOR NEXT SESSION: assess and progress HEP as indicated, strengthening, flexibility   Juel Burrow, PT 05/01/2022, 8:46 AM  [facility] [address], [place], [place] [address] Phone # [phone] Fax [phone]

## 2022-05-02 ENCOUNTER — Other Ambulatory Visit: Payer: Medicare HMO

## 2022-05-03 ENCOUNTER — Ambulatory Visit: Payer: Medicare HMO | Admitting: Rehabilitative and Restorative Service Providers"

## 2022-05-03 ENCOUNTER — Ambulatory Visit: Payer: Self-pay | Admitting: Genetic Counselor

## 2022-05-03 ENCOUNTER — Telehealth: Payer: Self-pay | Admitting: Genetic Counselor

## 2022-05-03 ENCOUNTER — Encounter: Payer: Self-pay | Admitting: Rehabilitative and Restorative Service Providers"

## 2022-05-03 DIAGNOSIS — Z1379 Encounter for other screening for genetic and chromosomal anomalies: Secondary | ICD-10-CM | POA: Insufficient documentation

## 2022-05-03 DIAGNOSIS — R2689 Other abnormalities of gait and mobility: Secondary | ICD-10-CM

## 2022-05-03 DIAGNOSIS — M6281 Muscle weakness (generalized): Secondary | ICD-10-CM

## 2022-05-03 DIAGNOSIS — M79602 Pain in left arm: Secondary | ICD-10-CM

## 2022-05-03 DIAGNOSIS — M542 Cervicalgia: Secondary | ICD-10-CM

## 2022-05-03 DIAGNOSIS — R252 Cramp and spasm: Secondary | ICD-10-CM

## 2022-05-03 NOTE — Progress Notes (Signed)
HPI:  Mr. Spinella was previously seen in the [place] clinic due to a family history of cancer and concerns regarding a hereditary predisposition to cancer. Please refer to our prior cancer genetics clinic note for more information regarding our discussion, assessment and recommendations, at the time. Mr. Alwin recent genetic test results were disclosed to him, as were recommendations warranted by these results. These results and recommendations are discussed in more detail below.  CANCER HISTORY:  Oncology History   No history exists.    FAMILY HISTORY:  We obtained a detailed, 4-generation family history.  Significant diagnoses are listed below: Family History  Problem Relation Age of Onset   Arthritis Mother    Heart disease Mother    Hypertension Mother    Heart disease Maternal Grandmother    Alcoholism Maternal Grandmother    Cancer Maternal Grandfather 78       NOS   Heart disease Half-Sister        mat half   Cancer Cousin 70       mat 2nd cousin; NOS       The patient has one daughter who is cancer free.  He has a maternal half sister and a paternal half sister and brother who are cancer free.  Both parents are deceased.   The patient does not have any information on his father's side of the family.  His father died in his 23's.   The patient's mother died at 41 from heart disease.  She had a sister who was cancer free.  The maternal grandfather died of an unknown cancer.  The grandmother died of heart disease and her sister's granddaughter died of an unknown cancer.   Mr. Bergeson is unaware of previous family history of genetic testing for hereditary cancer risks. Patient's maternal ancestors are of Hungarian/C[place] descent, and paternal ancestors are of [place] European descent. There is no reported Ashkenazi Jewish ancestry. There is no known consanguinity  GENETIC TEST RESULTS: Genetic testing reported out on May 01, 2022 through the  CancerNext-Expanded+RNAinsight cancer panel found no pathogenic mutations. The CancerNext-Expanded gene panel offered by [facility] and includes sequencing and rearrangement analysis for the following 77 genes: AIP, ALK, APC*, ATM*, AXIN2, BAP1, BARD1, BLM, BMPR1A, BRCA1*, BRCA2*, BRIP1*, CDC73, CDH1*, CDK4, CDKN1B, CDKN2A, CHEK2*, CTNNA1, DICER1, FANCC, FH, FLCN, GALNT12, KIF1B, LZTR1, MAX, MEN1, MET, MLH1*, MSH2*, MSH3, MSH6*, MUTYH*, NBN, NF1*, NF2, NTHL1, PALB2*, PHOX2B, PMS2*, POT1, PRKAR1A, PTCH1, PTEN*, RAD51C*, RAD51D*, RB1, RECQL, RET, SDHA, SDHAF2, SDHB, SDHC, SDHD, SMAD4, SMARCA4, SMARCB1, SMARCE1, STK11, SUFU, TMEM127, TP53*, TSC1, TSC2, VHL and XRCC2 (sequencing and deletion/duplication); EGFR, EGLN1, HOXB13, KIT, MITF, PDGFRA, POLD1, and POLE (sequencing only); EPCAM and GREM1 (deletion/duplication only). DNA and RNA analyses performed for * genes. . The test report has been scanned into EPIC and is located under the Molecular Pathology section of the Results Review tab.  A portion of the result report is included below for reference.     We discussed with Mr. Klingler that because current genetic testing is not perfect, it is possible there may be a gene mutation in one of these genes that current testing cannot detect, but that chance is small.  We also discussed, that there could be another gene that has not yet been discovered, or that we have not yet tested, that is responsible for the cancer diagnoses in the family. It is also possible there is a hereditary cause for the cancer in the family that Mr. Wyszynski did not  inherit and therefore was not identified in his testing.  Therefore, it is important to remain in touch with cancer genetics in the future so that we can continue to offer Mr. Abdelaziz the most up to date genetic testing.   ADDITIONAL GENETIC TESTING: We discussed with Mr. Quast that his genetic testing was fairly extensive.  If there are genes identified to increase cancer risk  that can be analyzed in the future, we would be happy to discuss and coordinate this testing at that time.    CANCER SCREENING RECOMMENDATIONS: Mr. Pullin test result is considered negative (normal).  This means that we have not identified a hereditary cause for his family history of cancer at this time. Most cancers happen by chance and this negative test suggests that his cancer may fall into this category.    While reassuring, this does not definitively rule out a hereditary predisposition to cancer. It is still possible that there could be genetic mutations that are undetectable by current technology. There could be genetic mutations in genes that have not been tested or identified to increase cancer risk.  Therefore, it is recommended he continue to follow the cancer management and screening guidelines provided by his primary healthcare provider.   An individual's cancer risk and medical management are not determined by genetic test results alone. Overall cancer risk assessment incorporates additional factors, including personal medical history, family history, and any available genetic information that may result in a personalized plan for cancer prevention and surveillance  RECOMMENDATIONS FOR FAMILY MEMBERS:  Individuals in this family might be at some increased risk of developing cancer, over the general population risk, simply due to the family history of cancer.  We recommended women in this family have a yearly mammogram beginning at age 28, or 67 years younger than the earliest onset of cancer, an annual clinical breast exam, and perform monthly breast self-exams. Women in this family should also have a gynecological exam as recommended by their primary provider. All family members should be referred for colonoscopy starting at age 42.  FOLLOW-UP: Lastly, we discussed with Mr. Abdon that cancer genetics is a rapidly advancing field and it is possible that new genetic tests will be  appropriate for him and/or his family members in the future. We encouraged him to remain in contact with cancer genetics on an annual basis so we can update his personal and family histories and let him know of advances in cancer genetics that may benefit this family.   Our contact number was provided. Mr. Bowen questions were answered to his satisfaction, and he knows he is welcome to call us at anytime with additional questions or concerns.   Roma Kayser, [place], [facility] Licensed, Certified Genetic Counselor [email or web address]

## 2022-05-03 NOTE — Telephone Encounter (Signed)
Revealed negative genetic testing.  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 It could be due to a different gene that we are not testing, or maybe our current technology may not be able to pick something up.  It will be important for him to keep in contact with genetics to keep up with whether additional testing may be needed.

## 2022-05-03 NOTE — Therapy (Signed)
OUTPATIENT PHYSICAL THERAPY TREATMENT NOTE   Patient Name: Mark Travis MRN: [id number] DOB:05-Feb-1951, 72 y.o., male Today's Date: 05/03/2022  END OF SESSION:  PT End of Session - 05/03/22 0931     Visit Number 2    Date for PT Re-Evaluation 06/21/22    Authorization Type [employer] (no auth required)    Progress Note Due on Visit 10    PT Start Time 0928    PT Stop Time 1010    PT Time Calculation (min) 42 min    Activity Tolerance Patient tolerated treatment well    Behavior During Therapy WFL for tasks assessed/performed             Past Medical History:  Diagnosis Date   Chronic low back pain    Decreased hearing    Diverticulosis    Emphysema (subcutaneous) (surgical) resulting from a procedure    Family history of cancer    Fatty liver    Glaucoma    Hiatal hernia    IBS (irritable bowel syndrome)    OSA on CPAP    Prostatitis    Seasonal allergies    Past Surgical History:  Procedure Laterality Date   COLONOSCOPY  10/2012   L trigger finger release     Patient Active Problem List   Diagnosis Date Noted   Family history of cancer 03/26/2022   Myalgia due to statin 03/15/2022   Coronary artery calcification 03/15/2022   Aortic atherosclerosis ([place]) 03/15/2022   Chest pain of uncertain etiology 58/52/7782   Right carotid bruit 08/28/2021   Claudication of both lower extremities ([place]) 08/28/2021   Tobacco abuse 08/28/2021   Hypertension associated with diabetes ([place]) 08/28/2021   Allergic rhinitis due to animal (cat) (dog) hair and dander 08/07/2021   Allergic rhinitis due to pollen 08/07/2021   Chronic allergic conjunctivitis 08/07/2021   Chronic rhinitis 08/07/2021   Multiple thyroid nodules 05/09/2021   Nocturia 05/23/2020   Right knee pain 04/27/2020   Cervical radiculopathy 12/24/2019   Pulmonary nodule less than 6 cm determined by computed tomography of lung 11/25/2019   Hyperlipidemia 11/09/2019   Ptosis of right eyelid 05/27/2019    Deviated septum 03/31/2019   Type 2 diabetes mellitus ([place]) 03/23/2019   Chronic low back pain 03/19/2019   Fatty liver 03/19/2019   Other irritable bowel syndrome 03/19/2019   Other specified glaucoma 03/19/2019   Seasonal allergies 03/19/2019   Pulmonary emphysema ([place]) 12/09/2018   Loud snoring 12/09/2018   OSA and COPD overlap syndrome ([place]) 12/09/2018   Elevated prostate specific antigen (PSA) 05/27/2018   Patellofemoral syndrome of left knee 04/16/2017   Patellofemoral syndrome of right knee 04/16/2017   Primary open angle glaucoma of both eyes, moderate stage 11/11/2015   Cataract, nuclear, bilateral 09/18/2013   Degenerative progressive high myopia, bilateral 09/18/2013    PCP: Heywood Bene, PA-C  REFERRING PROVIDER: Kristeen Miss, MD  REFERRING DIAG: [phone] (ICD-10-CM) - Other spondylosis, cervical region  THERAPY DIAG:  Cervicalgia  Muscle weakness (generalized)  Cramp and spasm  Pain in left arm  Other abnormalities of gait and mobility  Rationale for Evaluation and Treatment: Rehabilitation  ONSET DATE: chronic, but worse the past year  SUBJECTIVE:  SUBJECTIVE STATEMENT: Pt reports that he slept poorly and woke up achy this morning.  PERTINENT HISTORY:  Diabetes, Hypertension, Glaucoma, OA  PAIN:  Are you having pain? Yes: NPRS scale: 3/10 Pain location: cervical and radiating to left arm Pain description: tingling/aching Aggravating factors: sleeping, overhead activities Relieving factors: traction, thermal modalities  PRECAUTIONS: None  WEIGHT BEARING RESTRICTIONS: No  FALLS:  Has patient fallen in last 6 months? Yes. Number of falls 1 due to a vasovagal reaction when waking up and getting up to go to the bathroom  LIVING  ENVIRONMENT: Lives with: lives with their spouse Lives in: House/apartment Stairs: Yes: Internal: 15 steps; on right going up Has following equipment at home:  has purchased a shower grab bar, but has not installed yet  OCCUPATION: part-time tax Press photographer, works primarily during tax season  PLOF: Independent and Leisure: used to enjoy boating, photography, reading, gaming on computer  PATIENT GOALS: To have more consistency on decreased pain level.  NEXT MD VISIT: July 2024  OBJECTIVE:   DIAGNOSTIC FINDINGS:  Older MRI and Cervical Radiographs in 2022, will likely need updated pending visit in July 2024.  PATIENT SURVEYS:  Eval:  FOTO 53% (projected 57% by visit 11)  COGNITION: Overall cognitive status: Within functional limits for tasks assessed  SENSATION: Eval:  reports tingling down left arm and occasionally left leg  POSTURE: rounded shoulders and forward head  PALPATION: Eval:  Mild tightness and spasms with cervical paraspinals and upper traps   CERVICAL ROM:   Active ROM A/PROM (deg) eval  Flexion 50  Extension 30  Right lateral flexion 20  Left lateral flexion 15  Right rotation 40  Left rotation 45   (Blank rows = not tested)  UPPER EXTREMITY ROM:  WFL  UPPER EXTREMITY MMT: Eval:   Right UE is 5/5, Left shoulder is 4+ to 5-/5 Right hip strength WFL, Left hip is 4 to 4+/5   FUNCTIONAL TESTS:  Eval: 5 times sit to stand: 14.9 sec Single Leg Stance:  Right- 4.7 sec, Left- 2.7 sec  TODAY'S TREATMENT:                                                                                                                               DATE: 05/03/2022  UBE level 1.0 x3 min each direction with PT present to discuss status Cervical rotation x10 reps Cervical retraction into ball 2x10 Seated with ball against ball performing extension 2x10 Standing at stairs hamstring stretch 2x20 sec Standing rows and shoulder extension with green tband 2x10 Standing  shoulder ER and horizontal abduction with green tband 2x10 Manual Therapy:  soft tissue mobilization to cervical paraspinals and upper traps, manual trigger point release to upper traps, suboccipital release and manual traction.   DATE: 05/01/2022  Reviewed HEP (see below)  PATIENT EDUCATION:  Education details: Issued HEP Person educated: Patient Education method: Explanation, Demonstration, and Handouts Education comprehension: verbalized understanding and returned demonstration  HOME  EXERCISE PROGRAM: Access Code: [email or web address] URL: [email or web address] Date: 05/01/2022 Prepared by: Shelby Dubin Deshanna Kama  Exercises - Seated Cervical Retraction  - 3 x daily - 7 x weekly - 1 sets - 10 reps - 3-5 sec hold - Seated Cervical Extension AROM  - 1 x daily - 7 x weekly - 2 sets - 10 reps - Seated Cervical Sidebending AROM  - 3 x daily - 7 x weekly - 1 sets - 3 reps - 20 hold - Seated Cervical Rotation AROM  - 3 x daily - 7 x weekly - 1 sets - 3 reps - 20 hold - Seated Correct Posture  - 1 x daily - 7 x weekly - 3 sets - 10 reps - Standing Row with Anchored Resistance  - 1 x daily - 7 x weekly - 3 sets - 10 reps - Single Arm Shoulder Extension with Anchored Resistance  - 1 x daily - 7 x weekly - 3 sets - 10 reps - Wall Push Up  - 1 x daily - 7 x weekly - 2 sets - 10 reps  ASSESSMENT:  CLINICAL IMPRESSION: Mr Federici presents to skilled PT with reports of feeling aching this morning after waking up.  Patient able to progress with strengthening during session today with use of green theraband.  Performed manual therapy at end of session secondary to increased aching.  Patient reported decreased aching and improved ease with A/ROM following.   OBJECTIVE IMPAIRMENTS: Abnormal gait, decreased balance, decreased strength, increased muscle spasms, impaired flexibility, postural dysfunction, and pain.   ACTIVITY LIMITATIONS: carrying, lifting, and bending  PARTICIPATION LIMITATIONS: community  activity  PERSONAL FACTORS: Past/current experiences, Time since onset of injury/illness/exacerbation, and 1-2 comorbidities: OA, Diabetes  are also affecting patient's functional outcome.   REHAB POTENTIAL: Good  CLINICAL DECISION MAKING: Evolving/moderate complexity  EVALUATION COMPLEXITY: Moderate   GOALS: Goals reviewed with patient? Yes  SHORT TERM GOALS: Target date: 05/17/2022  Pt will be independent with initial HEP. Baseline:  Goal status: IN PROGRESS  2.  Patient will report at least 30% improvement in symptoms/decreased pain since starting therapy. Baseline: 4/10 Goal status: INITIAL   LONG TERM GOALS: Target date: 06/21/2022  Pt will be independent with advanced HEP and knowledge of independent progression. Baseline:  Goal status: INITIAL  2.  Patient will increase FOTO to at least 57% to demonstrate improvements in functional mobility. Baseline: 53% Goal status: INITIAL  3.  Patient will increase cervical A/ROM to [facility] to allow him to drive without any increased strain to neck. Baseline: see chart Goal status: INITIAL  4.  Patient will increase left shoulder and hip strength to at least 5-/5 throughout to allow him to lift objects without difficulty. Baseline: 4 to 4+/5 Goal status: INITIAL  5.  Patient will increase bilateral single leg stance to at least 20 seconds to decrease risk of falling. Baseline: less than 5 sec on each side Goal status: INITIAL   PLAN:  PT FREQUENCY: 2x/week  PT DURATION: 8 weeks  PLANNED INTERVENTIONS: Therapeutic exercises, Therapeutic activity, Neuromuscular re-education, Balance training, Gait training, Patient/Family education, Self Care, Joint mobilization, Joint manipulation, Aquatic Therapy, Dry Needling, Electrical stimulation, Spinal manipulation, Spinal mobilization, Cryotherapy, Moist heat, Taping, Traction, Ultrasound, Ionotophoresis '4mg'$ /ml Dexamethasone, Manual therapy, and Re-evaluation  PLAN FOR NEXT SESSION:  assess and progress HEP as indicated, strengthening, flexibility, aquatic PT, balance   Juel Burrow, PT 05/03/2022, 10:16 AM  [facility] [address] [place], [place] [address] Phone # [phone] Fax  [phone]     

## 2022-05-07 ENCOUNTER — Ambulatory Visit (HOSPITAL_BASED_OUTPATIENT_CLINIC_OR_DEPARTMENT_OTHER): Payer: Medicare HMO | Attending: Neurological Surgery | Admitting: Physical Therapy

## 2022-05-07 DIAGNOSIS — M542 Cervicalgia: Secondary | ICD-10-CM | POA: Insufficient documentation

## 2022-05-07 DIAGNOSIS — M47892 Other spondylosis, cervical region: Secondary | ICD-10-CM | POA: Diagnosis present

## 2022-05-07 DIAGNOSIS — M6281 Muscle weakness (generalized): Secondary | ICD-10-CM | POA: Diagnosis not present

## 2022-05-07 DIAGNOSIS — R252 Cramp and spasm: Secondary | ICD-10-CM

## 2022-05-07 NOTE — Therapy (Signed)
OUTPATIENT PHYSICAL THERAPY TREATMENT NOTE   Patient Name: Mark Travis MRN: [id number] DOB:1950/10/11, 72 y.o., male Today's Date: 05/07/2022  END OF SESSION:  PT End of Session - 05/07/22 1712     Visit Number 3    Date for PT Re-Evaluation 06/21/22    Authorization Type [employer] (no auth required)    Progress Note Due on Visit 10    PT Start Time 1700    PT Stop Time 1740    PT Time Calculation (min) 40 min    Activity Tolerance Patient tolerated treatment well    Behavior During Therapy WFL for tasks assessed/performed             Past Medical History:  Diagnosis Date   Chronic low back pain    Decreased hearing    Diverticulosis    Emphysema (subcutaneous) (surgical) resulting from a procedure    Family history of cancer    Fatty liver    Glaucoma    Hiatal hernia    IBS (irritable bowel syndrome)    OSA on CPAP    Prostatitis    Seasonal allergies    Past Surgical History:  Procedure Laterality Date   COLONOSCOPY  10/2012   L trigger finger release     Patient Active Problem List   Diagnosis Date Noted   Genetic testing 05/03/2022   Family history of cancer 03/26/2022   Myalgia due to statin 03/15/2022   Coronary artery calcification 03/15/2022   Aortic atherosclerosis ([place]) 03/15/2022   Chest pain of uncertain etiology 62/37/6283   Right carotid bruit 08/28/2021   Claudication of both lower extremities ([place]) 08/28/2021   Tobacco abuse 08/28/2021   Hypertension associated with diabetes ([place]) 08/28/2021   Allergic rhinitis due to animal (cat) (dog) hair and dander 08/07/2021   Allergic rhinitis due to pollen 08/07/2021   Chronic allergic conjunctivitis 08/07/2021   Chronic rhinitis 08/07/2021   Multiple thyroid nodules 05/09/2021   Nocturia 05/23/2020   Right knee pain 04/27/2020   Cervical radiculopathy 12/24/2019   Pulmonary nodule less than 6 cm determined by computed tomography of lung 11/25/2019   Hyperlipidemia 11/09/2019   Ptosis of  right eyelid 05/27/2019   Deviated septum 03/31/2019   Type 2 diabetes mellitus ([place]) 03/23/2019   Chronic low back pain 03/19/2019   Fatty liver 03/19/2019   Other irritable bowel syndrome 03/19/2019   Other specified glaucoma 03/19/2019   Seasonal allergies 03/19/2019   Pulmonary emphysema ([place]) 12/09/2018   Loud snoring 12/09/2018   OSA and COPD overlap syndrome ([place]) 12/09/2018   Elevated prostate specific antigen (PSA) 05/27/2018   Patellofemoral syndrome of left knee 04/16/2017   Patellofemoral syndrome of right knee 04/16/2017   Primary open angle glaucoma of both eyes, moderate stage 11/11/2015   Cataract, nuclear, bilateral 09/18/2013   Degenerative progressive high myopia, bilateral 09/18/2013    PCP: Heywood Bene, PA-C  REFERRING PROVIDER: Kristeen Miss, MD  REFERRING DIAG: [phone] (ICD-10-CM) - Other spondylosis, cervical region  THERAPY DIAG:  Cervicalgia  Muscle weakness (generalized)  Cramp and spasm  Rationale for Evaluation and Treatment: Rehabilitation  ONSET DATE: chronic, but worse the past year  SUBJECTIVE:  SUBJECTIVE STATEMENT: Pt reports that he slept poorly last night.  Had a massage yesterday. He plans to try a different pillow tonight.   PERTINENT HISTORY:  Diabetes, Hypertension, Glaucoma, OA  PAIN:  Are you having pain? No : NPRS scale: 0/10 - only a 1/10 with end range rotation Pain location: points to Rt upper trap  Pain description: tingling/aching Aggravating factors: sleeping, overhead activities Relieving factors: traction, thermal modalities  PRECAUTIONS: None  WEIGHT BEARING RESTRICTIONS: No  FALLS:  Has patient fallen in last 6 months? Yes. Number of falls 1 due to a vasovagal reaction when waking up and getting up to  go to the bathroom  LIVING ENVIRONMENT: Lives with: lives with their spouse Lives in: House/apartment Stairs: Yes: Internal: 15 steps; on right going up Has following equipment at home:  has purchased a shower grab bar, but has not installed yet  OCCUPATION: part-time tax Press photographer, works primarily during tax season  PLOF: Independent and Leisure: used to enjoy boating, photography, reading, gaming on computer  PATIENT GOALS: To have more consistency on decreased pain level.  NEXT MD VISIT: July 2024  OBJECTIVE:   DIAGNOSTIC FINDINGS:  Older MRI and Cervical Radiographs in 2022, will likely need updated pending visit in July 2024.  PATIENT SURVEYS:  Eval:  FOTO 53% (projected 57% by visit 11)  COGNITION: Overall cognitive status: Within functional limits for tasks assessed  SENSATION: Eval:  reports tingling down left arm and occasionally left leg  POSTURE: rounded shoulders and forward head  PALPATION: Eval:  Mild tightness and spasms with cervical paraspinals and upper traps   CERVICAL ROM:   Active ROM A/PROM (deg) eval  Flexion 50  Extension 30  Right lateral flexion 20  Left lateral flexion 15  Right rotation 40  Left rotation 45   (Blank rows = not tested)  UPPER EXTREMITY ROM:  WFL  UPPER EXTREMITY MMT: Eval:   Right UE is 5/5, Left shoulder is 4+ to 5-/5 Right hip strength WFL, Left hip is 4 to 4+/5   FUNCTIONAL TESTS:  Eval: 5 times sit to stand: 14.9 sec Single Leg Stance:  Right- 4.7 sec, Left- 2.7 sec  TODAY'S TREATMENT:                                                                                                                              DATE: 05/07/2022  Pt seen for aquatic therapy today.  Treatment took place in water 3.25-4.5 ft in depth at the [facility]. Temp of water was 91.  Pt entered/exited the pool via stairs independently with single rail.  * walking forward/backward with reciprocal arm swing; side stepping  with bilat shoulder add/abdct (no floats) * standard stance with bilat shoulder horiz abdct/ addct; L's; W's; bilat shoulder addct with rainbow hand floats x 10; bilat tricep push downs with rainbow -> yellow hand floats (cues for scap depression) ; alternating shoulder flex/ext (0-~80) with rainbow hand floats x  5 each * wall push ups with cues for elbows in at side, and scap depression x 5 - stopped due to poor form and switched to next exercise * staggered stance with row with yellow hand floats on surface -> kick board row x 10 with each foot forward * supported back float with noodle under arms/legs/ and nekdoodle for decompression of spine x 3 min Pt requires the buoyancy and hydrostatic pressure of water for support, and to offload joints by unweighting joint load by at least 50 % in navel deep water and by at least 75-80% in chest to neck deep water.  Viscosity of the water is needed for resistance of strengthening. Water current perturbations provides challenge to standing balance requiring increased core activation.    DATE: 05/03/2022  UBE level 1.0 x3 min each direction with PT present to discuss status Cervical rotation x10 reps Cervical retraction into ball 2x10 Seated with ball against ball performing extension 2x10 Standing at stairs hamstring stretch 2x20 sec Standing rows and shoulder extension with green tband 2x10 Standing shoulder ER and horizontal abduction with green tband 2x10 Manual Therapy:  soft tissue mobilization to cervical paraspinals and upper traps, manual trigger point release to upper traps, suboccipital release and manual traction.   DATE: 05/01/2022  Reviewed HEP (see below)  PATIENT EDUCATION:  Education details: Issued HEP Person educated: Patient Education method: Explanation, Demonstration, and Handouts Education comprehension: verbalized understanding and returned demonstration  HOME EXERCISE PROGRAM: Access Code: [email or web address] URL:  [email or web address] Date: 05/01/2022 Prepared by: Shelby Dubin Menke  Exercises - Seated Cervical Retraction  - 3 x daily - 7 x weekly - 1 sets - 10 reps - 3-5 sec hold - Seated Cervical Extension AROM  - 1 x daily - 7 x weekly - 2 sets - 10 reps - Seated Cervical Sidebending AROM  - 3 x daily - 7 x weekly - 1 sets - 3 reps - 20 hold - Seated Cervical Rotation AROM  - 3 x daily - 7 x weekly - 1 sets - 3 reps - 20 hold - Seated Correct Posture  - 1 x daily - 7 x weekly - 3 sets - 10 reps - Standing Row with Anchored Resistance  - 1 x daily - 7 x weekly - 3 sets - 10 reps - Single Arm Shoulder Extension with Anchored Resistance  - 1 x daily - 7 x weekly - 3 sets - 10 reps - Wall Push Up  - 1 x daily - 7 x weekly - 2 sets - 10 reps  ASSESSMENT:  CLINICAL IMPRESSION: Pt is confident in aquatic environment and able to take direction from therapist on deck.  He requires minor cueing throughout for more neutral scapula (vs rounded forward R>L).  He tolerated aquatic exercises well, without only minor discomfort with resisted shoulder addct. Goals are ongoing.    OBJECTIVE IMPAIRMENTS: Abnormal gait, decreased balance, decreased strength, increased muscle spasms, impaired flexibility, postural dysfunction, and pain.   ACTIVITY LIMITATIONS: carrying, lifting, and bending  PARTICIPATION LIMITATIONS: community activity  PERSONAL FACTORS: Past/current experiences, Time since onset of injury/illness/exacerbation, and 1-2 comorbidities: OA, Diabetes  are also affecting patient's functional outcome.   REHAB POTENTIAL: Good  CLINICAL DECISION MAKING: Evolving/moderate complexity  EVALUATION COMPLEXITY: Moderate   GOALS: Goals reviewed with patient? Yes  SHORT TERM GOALS: Target date: 05/17/2022  Pt will be independent with initial HEP. Baseline:  Goal status: IN PROGRESS  2.  Patient will report at least 30% improvement in symptoms/decreased pain  since starting  therapy. Baseline: 4/10 Goal status: INITIAL   LONG TERM GOALS: Target date: 06/21/2022  Pt will be independent with advanced HEP and knowledge of independent progression. Baseline:  Goal status: INITIAL  2.  Patient will increase FOTO to at least 57% to demonstrate improvements in functional mobility. Baseline: 53% Goal status: INITIAL  3.  Patient will increase cervical A/ROM to [facility] to allow him to drive without any increased strain to neck. Baseline: see chart Goal status: INITIAL  4.  Patient will increase left shoulder and hip strength to at least 5-/5 throughout to allow him to lift objects without difficulty. Baseline: 4 to 4+/5 Goal status: INITIAL  5.  Patient will increase bilateral single leg stance to at least 20 seconds to decrease risk of falling. Baseline: less than 5 sec on each side Goal status: INITIAL   PLAN:  PT FREQUENCY: 2x/week  PT DURATION: 8 weeks  PLANNED INTERVENTIONS: Therapeutic exercises, Therapeutic activity, Neuromuscular re-education, Balance training, Gait training, Patient/Family education, Self Care, Joint mobilization, Joint manipulation, Aquatic Therapy, Dry Needling, Electrical stimulation, Spinal manipulation, Spinal mobilization, Cryotherapy, Moist heat, Taping, Traction, Ultrasound, Ionotophoresis '4mg'$ /ml Dexamethasone, Manual therapy, and Re-evaluation  PLAN FOR NEXT SESSION: assess and progress HEP as indicated, strengthening, flexibility, aquatic PT, balance  Kerin Perna, PTA 05/07/22 6:00 PM [facility] [address] [place], [place], [address] Phone: [phone]   Fax:  [phone]

## 2022-05-09 ENCOUNTER — Ambulatory Visit: Payer: Medicare HMO

## 2022-05-09 DIAGNOSIS — R252 Cramp and spasm: Secondary | ICD-10-CM

## 2022-05-09 DIAGNOSIS — M6281 Muscle weakness (generalized): Secondary | ICD-10-CM

## 2022-05-09 DIAGNOSIS — M542 Cervicalgia: Secondary | ICD-10-CM

## 2022-05-09 DIAGNOSIS — M79602 Pain in left arm: Secondary | ICD-10-CM

## 2022-05-09 DIAGNOSIS — R2689 Other abnormalities of gait and mobility: Secondary | ICD-10-CM

## 2022-05-09 NOTE — Therapy (Addendum)
OUTPATIENT PHYSICAL THERAPY TREATMENT NOTE   Patient Name: Mark Travis MRN: [id number] DOB:[birth year], 72 y.o., male Today's Date: 05/09/2022  END OF SESSION:  PT End of Session - 05/09/22 1050     Visit Number 4    Date for PT Re-Evaluation 06/21/22    Authorization Type [employer] (no auth required)    Progress Note Due on Visit 10    PT Start Time 1017    PT Stop Time 1100    PT Time Calculation (min) 43 min    Activity Tolerance Patient tolerated treatment well    Behavior During Therapy WFL for tasks assessed/performed              Past Medical History:  Diagnosis Date   Chronic low back pain    Decreased hearing    Diverticulosis    Emphysema (subcutaneous) (surgical) resulting from a procedure    Family history of cancer    Fatty liver    Glaucoma    Hiatal hernia    IBS (irritable bowel syndrome)    OSA on CPAP    Prostatitis    Seasonal allergies    Past Surgical History:  Procedure Laterality Date   COLONOSCOPY  10/2012   L trigger finger release     Patient Active Problem List   Diagnosis Date Noted   Genetic testing 05/03/2022   Family history of cancer 03/26/2022   Myalgia due to statin 03/15/2022   Coronary artery calcification 03/15/2022   Aortic atherosclerosis ([place]) 03/15/2022   Chest pain of uncertain etiology 52/77/8242   Right carotid bruit 08/28/2021   Claudication of both lower extremities ([place]) 08/28/2021   Tobacco abuse 08/28/2021   Hypertension associated with diabetes ([place]) 08/28/2021   Allergic rhinitis due to animal (cat) (dog) hair and dander 08/07/2021   Allergic rhinitis due to pollen 08/07/2021   Chronic allergic conjunctivitis 08/07/2021   Chronic rhinitis 08/07/2021   Multiple thyroid nodules 05/09/2021   Nocturia 05/23/2020   Right knee pain 04/27/2020   Cervical radiculopathy 12/24/2019   Pulmonary nodule less than 6 cm determined by computed tomography of lung 11/25/2019   Hyperlipidemia 11/09/2019   Ptosis  of right eyelid 05/27/2019   Deviated septum 03/31/2019   Type 2 diabetes mellitus ([place]) 03/23/2019   Chronic low back pain 03/19/2019   Fatty liver 03/19/2019   Other irritable bowel syndrome 03/19/2019   Other specified glaucoma 03/19/2019   Seasonal allergies 03/19/2019   Pulmonary emphysema ([place]) 12/09/2018   Loud snoring 12/09/2018   OSA and COPD overlap syndrome ([place]) 12/09/2018   Elevated prostate specific antigen (PSA) 05/27/2018   Patellofemoral syndrome of left knee 04/16/2017   Patellofemoral syndrome of right knee 04/16/2017   Primary open angle glaucoma of both eyes, moderate stage 11/11/2015   Cataract, nuclear, bilateral 09/18/2013   Degenerative progressive high myopia, bilateral 09/18/2013    PCP: Heywood Bene, PA-C  REFERRING PROVIDER: Kristeen Miss, MD  REFERRING DIAG: [phone] (ICD-10-CM) - Other spondylosis, cervical region  THERAPY DIAG:  Cervicalgia  Muscle weakness (generalized)  Cramp and spasm  Pain in left arm  Other abnormalities of gait and mobility  Rationale for Evaluation and Treatment: Rehabilitation  ONSET DATE: chronic, but worse the past year  SUBJECTIVE:  SUBJECTIVE STATEMENT: I am a little sore from the water therapy earlier this week.  I have a traction unit at home but it is acting up.  That really helped me in the past.    PERTINENT HISTORY:  Diabetes, Hypertension, Glaucoma, OA  PAIN:  Are you having pain? No : NPRS scale: no pain, just tightness Pain location: points to Rt upper trap  Pain description: tingling/aching Aggravating factors: sleeping, overhead activities Relieving factors: traction, thermal modalities  PRECAUTIONS: None  WEIGHT BEARING RESTRICTIONS: No  FALLS:  Has patient fallen in last 6 months?  Yes. Number of falls 1 due to a vasovagal reaction when waking up and getting up to go to the bathroom  LIVING ENVIRONMENT: Lives with: lives with their spouse Lives in: House/apartment Stairs: Yes: Internal: 15 steps; on right going up Has following equipment at home:  has purchased a shower grab bar, but has not installed yet  OCCUPATION: part-time tax Press photographer, works primarily during tax season  PLOF: Independent and Leisure: used to enjoy boating, photography, reading, gaming on computer  PATIENT GOALS: To have more consistency on decreased pain level.  NEXT MD VISIT: July 2024  OBJECTIVE:   DIAGNOSTIC FINDINGS:  Older MRI and Cervical Radiographs in 2022, will likely need updated pending visit in July 2024.  PATIENT SURVEYS:  Eval:  FOTO 53% (projected 57% by visit 11)  COGNITION: Overall cognitive status: Within functional limits for tasks assessed  SENSATION: Eval:  reports tingling down left arm and occasionally left leg  POSTURE: rounded shoulders and forward head  PALPATION: Eval:  Mild tightness and spasms with cervical paraspinals and upper traps   CERVICAL ROM:   Active ROM A/PROM (deg) eval  Flexion 50  Extension 30  Right lateral flexion 20  Left lateral flexion 15  Right rotation 40  Left rotation 45   (Blank rows = not tested)  UPPER EXTREMITY ROM:  WFL  UPPER EXTREMITY MMT: Eval:   Right UE is 5/5, Left shoulder is 4+ to 5-/5 Right hip strength WFL, Left hip is 4 to 4+/5   FUNCTIONAL TESTS:  Eval: 5 times sit to stand: 14.9 sec Single Leg Stance:  Right- 4.7 sec, Left- 2.7 sec  TODAY'S TREATMENT:         DATE: 05/09/2022  UBE level 1.0 x3 min each direction with PT present to discuss status Cervical rotation x10 reps Cervical retraction into ball 2x10 Standing rows and shoulder extension with green tband 2x10 Standing shoulder ER and horizontal abduction with green tband 2x10 Manual Therapy:  soft tissue mobilization to cervical  paraspinals and upper traps, manual trigger point release to upper traps, suboccipital release and manual traction.  Passive stretch into sidebending Mechanical traction: 15/5# 60 seconds/10 seconds x12 minutes                                                                                                                         DATE: 05/07/2022  Pt seen for aquatic therapy  today.  Treatment took place in water 3.25-4.5 ft in depth at the [facility]. Temp of water was 91.  Pt entered/exited the pool via stairs independently with single rail.  * walking forward/backward with reciprocal arm swing; side stepping with bilat shoulder add/abdct (no floats) * standard stance with bilat shoulder horiz abdct/ addct; L's; W's; bilat shoulder addct with rainbow hand floats x 10; bilat tricep push downs with rainbow -> yellow hand floats (cues for scap depression) ; alternating shoulder flex/ext (0-~80) with rainbow hand floats x 5 each * wall push ups with cues for elbows in at side, and scap depression x 5 - stopped due to poor form and switched to next exercise * staggered stance with row with yellow hand floats on surface -> kick board row x 10 with each foot forward * supported back float with noodle under arms/legs/ and nekdoodle for decompression of spine x 3 min Pt requires the buoyancy and hydrostatic pressure of water for support, and to offload joints by unweighting joint load by at least 50 % in navel deep water and by at least 75-80% in chest to neck deep water.  Viscosity of the water is needed for resistance of strengthening. Water current perturbations provides challenge to standing balance requiring increased core activation.   DATE: 05/03/2022  UBE level 1.0 x3 min each direction with PT present to discuss status Cervical rotation x10 reps Cervical retraction into ball 2x10 Seated with ball against ball performing extension 2x10 Standing at stairs hamstring stretch 2x20  sec Standing rows and shoulder extension with green tband 2x10 Standing shoulder ER and horizontal abduction with green tband 2x10 Manual Therapy:  soft tissue mobilization to cervical paraspinals and upper traps, manual trigger point release to upper traps, suboccipital release and manual traction.   PATIENT EDUCATION:  Education details: Issued HEP Person educated: Patient Education method: Explanation, Demonstration, and Handouts Education comprehension: verbalized understanding and returned demonstration  HOME EXERCISE PROGRAM: Access Code: [email or web address] URL: [email or web address] Date: 05/01/2022 Prepared by: Shelby Dubin Menke  Exercises - Seated Cervical Retraction  - 3 x daily - 7 x weekly - 1 sets - 10 reps - 3-5 sec hold - Seated Cervical Extension AROM  - 1 x daily - 7 x weekly - 2 sets - 10 reps - Seated Cervical Sidebending AROM  - 3 x daily - 7 x weekly - 1 sets - 3 reps - 20 hold - Seated Cervical Rotation AROM  - 3 x daily - 7 x weekly - 1 sets - 3 reps - 20 hold - Seated Correct Posture  - 1 x daily - 7 x weekly - 3 sets - 10 reps - Standing Row with Anchored Resistance  - 1 x daily - 7 x weekly - 3 sets - 10 reps - Single Arm Shoulder Extension with Anchored Resistance  - 1 x daily - 7 x weekly - 3 sets - 10 reps - Wall Push Up  - 1 x daily - 7 x weekly - 2 sets - 10 reps  ASSESSMENT:  CLINICAL IMPRESSION: Pt is compliant with HEP and reports that they help.  Pt has a home traction unit that not working properly right now.  He responded well to this in the past.  Pt is doing his stretches consistently but not as consistent with postural strength exercises.  Pt with tension and reduced mobility in the cervical spine and responded well to manual therapy.  Pt did well with traction today.  Patient will benefit  from skilled PT to address the below impairments and improve overall function.    OBJECTIVE IMPAIRMENTS: Abnormal gait, decreased balance, decreased  strength, increased muscle spasms, impaired flexibility, postural dysfunction, and pain.   ACTIVITY LIMITATIONS: carrying, lifting, and bending  PARTICIPATION LIMITATIONS: community activity  PERSONAL FACTORS: Past/current experiences, Time since onset of injury/illness/exacerbation, and 1-2 comorbidities: OA, Diabetes  are also affecting patient's functional outcome.   REHAB POTENTIAL: Good  CLINICAL DECISION MAKING: Evolving/moderate complexity  EVALUATION COMPLEXITY: Moderate   GOALS: Goals reviewed with patient? Yes  SHORT TERM GOALS: Target date: 05/17/2022  Pt will be independent with initial HEP. Baseline:  Goal status: IN PROGRESS  2.  Patient will report at least 30% improvement in symptoms/decreased pain since starting therapy. Baseline: 4/10 Goal status: INITIAL   LONG TERM GOALS: Target date: 06/21/2022  Pt will be independent with advanced HEP and knowledge of independent progression. Baseline:  Goal status: INITIAL  2.  Patient will increase FOTO to at least 57% to demonstrate improvements in functional mobility. Baseline: 53% Goal status: INITIAL  3.  Patient will increase cervical A/ROM to [facility] to allow him to drive without any increased strain to neck. Baseline: see chart Goal status: INITIAL  4.  Patient will increase left shoulder and hip strength to at least 5-/5 throughout to allow him to lift objects without difficulty. Baseline: 4 to 4+/5 Goal status: INITIAL  5.  Patient will increase bilateral single leg stance to at least 20 seconds to decrease risk of falling. Baseline: less than 5 sec on each side Goal status: INITIAL   PLAN:  PT FREQUENCY: 2x/week  PT DURATION: 8 weeks  PLANNED INTERVENTIONS: Therapeutic exercises, Therapeutic activity, Neuromuscular re-education, Balance training, Gait training, Patient/Family education, Self Care, Joint mobilization, Joint manipulation, Aquatic Therapy, Dry Needling, Electrical stimulation, Spinal  manipulation, Spinal mobilization, Cryotherapy, Moist heat, Taping, Traction, Ultrasound, Ionotophoresis '4mg'$ /ml Dexamethasone, Manual therapy, and Re-evaluation  PLAN FOR NEXT SESSION: assess and progress HEP as indicated, strengthening, flexibility, aquatic PT, balance  Sigurd Sos, PT 05/09/22 10:52 AM

## 2022-05-14 ENCOUNTER — Encounter (HOSPITAL_BASED_OUTPATIENT_CLINIC_OR_DEPARTMENT_OTHER): Payer: Self-pay | Admitting: Physical Therapy

## 2022-05-14 ENCOUNTER — Ambulatory Visit (HOSPITAL_BASED_OUTPATIENT_CLINIC_OR_DEPARTMENT_OTHER): Payer: Medicare HMO | Admitting: Physical Therapy

## 2022-05-14 DIAGNOSIS — M47892 Other spondylosis, cervical region: Secondary | ICD-10-CM | POA: Diagnosis not present

## 2022-05-14 DIAGNOSIS — R252 Cramp and spasm: Secondary | ICD-10-CM

## 2022-05-14 DIAGNOSIS — M6281 Muscle weakness (generalized): Secondary | ICD-10-CM

## 2022-05-14 DIAGNOSIS — M542 Cervicalgia: Secondary | ICD-10-CM

## 2022-05-14 DIAGNOSIS — M79602 Pain in left arm: Secondary | ICD-10-CM

## 2022-05-14 NOTE — Therapy (Signed)
OUTPATIENT PHYSICAL THERAPY TREATMENT NOTE   Patient Name: Mark Travis MRN: [id number] DOB:Sep 11, 1950, 72 y.o., male Today's Date: 05/14/2022  END OF SESSION:  PT End of Session - 05/14/22 1454     Visit Number 5    Date for PT Re-Evaluation 06/21/22    Authorization Type [employer] (no auth required)    PT Start Time 1447    PT Stop Time 1525    PT Time Calculation (min) 38 min    Behavior During Therapy WFL for tasks assessed/performed              Past Medical History:  Diagnosis Date   Chronic low back pain    Decreased hearing    Diverticulosis    Emphysema (subcutaneous) (surgical) resulting from a procedure    Family history of cancer    Fatty liver    Glaucoma    Hiatal hernia    IBS (irritable bowel syndrome)    OSA on CPAP    Prostatitis    Seasonal allergies    Past Surgical History:  Procedure Laterality Date   COLONOSCOPY  10/2012   L trigger finger release     Patient Active Problem List   Diagnosis Date Noted   Genetic testing 05/03/2022   Family history of cancer 03/26/2022   Myalgia due to statin 03/15/2022   Coronary artery calcification 03/15/2022   Aortic atherosclerosis ([place]) 03/15/2022   Chest pain of uncertain etiology 84/13/2440   Right carotid bruit 08/28/2021   Claudication of both lower extremities ([place]) 08/28/2021   Tobacco abuse 08/28/2021   Hypertension associated with diabetes ([place]) 08/28/2021   Allergic rhinitis due to animal (cat) (dog) hair and dander 08/07/2021   Allergic rhinitis due to pollen 08/07/2021   Chronic allergic conjunctivitis 08/07/2021   Chronic rhinitis 08/07/2021   Multiple thyroid nodules 05/09/2021   Nocturia 05/23/2020   Right knee pain 04/27/2020   Cervical radiculopathy 12/24/2019   Pulmonary nodule less than 6 cm determined by computed tomography of lung 11/25/2019   Hyperlipidemia 11/09/2019   Ptosis of right eyelid 05/27/2019   Deviated septum 03/31/2019   Type 2 diabetes mellitus ([place])  03/23/2019   Chronic low back pain 03/19/2019   Fatty liver 03/19/2019   Other irritable bowel syndrome 03/19/2019   Other specified glaucoma 03/19/2019   Seasonal allergies 03/19/2019   Pulmonary emphysema ([place]) 12/09/2018   Loud snoring 12/09/2018   OSA and COPD overlap syndrome ([place]) 12/09/2018   Elevated prostate specific antigen (PSA) 05/27/2018   Patellofemoral syndrome of left knee 04/16/2017   Patellofemoral syndrome of right knee 04/16/2017   Primary open angle glaucoma of both eyes, moderate stage 11/11/2015   Cataract, nuclear, bilateral 09/18/2013   Degenerative progressive high myopia, bilateral 09/18/2013    PCP: Heywood Bene, PA-C  REFERRING PROVIDER: Kristeen Miss, MD  REFERRING DIAG: [phone] (ICD-10-CM) - Other spondylosis, cervical region  THERAPY DIAG:  Cervicalgia  Muscle weakness (generalized)  Cramp and spasm  Pain in left arm  Rationale for Evaluation and Treatment: Rehabilitation  ONSET DATE: chronic, but worse the past year  SUBJECTIVE:  SUBJECTIVE STATEMENT: Pt reports he was sore in abdominals and back after aquatic session.  He also said he thinks the traction at last land visit was too much or too long, "woke up stiff this morning".   He has chiropractic visit tomorrow.    PERTINENT HISTORY:  Diabetes, Hypertension, Glaucoma, OA  PAIN:  Are you having pain?Yes : NPRS scale: 2-3/10 Pain location: post neck   Pain description: tingling/aching Aggravating factors: sleeping, overhead activities Relieving factors: traction, thermal modalities  PRECAUTIONS: None  WEIGHT BEARING RESTRICTIONS: No  FALLS:  Has patient fallen in last 6 months? Yes. Number of falls 1 due to a vasovagal reaction when waking up and getting up to go to the  bathroom  LIVING ENVIRONMENT: Lives with: lives with their spouse Lives in: House/apartment Stairs: Yes: Internal: 15 steps; on right going up Has following equipment at home:  has purchased a shower grab bar, but has not installed yet  OCCUPATION: part-time tax Press photographer, works primarily during tax season  PLOF: Independent and Leisure: used to enjoy boating, photography, reading, gaming on computer  PATIENT GOALS: To have more consistency on decreased pain level.  NEXT MD VISIT: July 2024  OBJECTIVE:   DIAGNOSTIC FINDINGS:  Older MRI and Cervical Radiographs in 2022, will likely need updated pending visit in July 2024.  PATIENT SURVEYS:  Eval:  FOTO 53% (projected 57% by visit 11)  COGNITION: Overall cognitive status: Within functional limits for tasks assessed  SENSATION: Eval:  reports tingling down left arm and occasionally left leg  POSTURE: rounded shoulders and forward head  PALPATION: Eval:  Mild tightness and spasms with cervical paraspinals and upper traps   CERVICAL ROM:   Active ROM A/PROM (deg) eval  Flexion 50  Extension 30  Right lateral flexion 20  Left lateral flexion 15  Right rotation 40  Left rotation 45   (Blank rows = not tested)  UPPER EXTREMITY ROM:  WFL  UPPER EXTREMITY MMT: Eval:   Right UE is 5/5, Left shoulder is 4+ to 5-/5 Right hip strength WFL, Left hip is 4 to 4+/5   FUNCTIONAL TESTS:  Eval: 5 times sit to stand: 14.9 sec Single Leg Stance:  Right- 4.7 sec, Left- 2.7 sec  TODAY'S TREATMENT:        DATE: 05/14/2022  Pt seen for aquatic therapy today.  Treatment took place in water 3.25-4.5 ft in depth at the [facility]. Temp of water was 91.  Pt entered/exited the pool via stairs independently with single rail.  * marching forward/backward with reciprocal arm swing; side stepping with bilat shoulder add/abdct (no floats) * standard stance with bilat shoulder horiz abdct/ addct; L's * return to walking  forward with arm swing * TrA set with straight arm noodle pull down, with focus on scap depression with slow eccentric return.  * bilat tricep push down with rainbow hand floats x 10; bilat shoulder addct with rainbow hand floats x 10 * standard stance with kick board push / pull,cues for scap retraction and increased speed; single resistance bell with reciprocal arm swing, and small circles at side * forward step with single arm row ( with resistance bell) and return to neutral x 10 each LE   * Holding low drain:  hip abdct x 10 each; hip ext to toe touch x 10 each; bilat heel raises x12 * SLS R/L - 10s; requires skulling for each side to steady after 2 seconds.    Pt requires the buoyancy and hydrostatic  pressure of water for support, and to offload joints by unweighting joint load by at least 50 % in navel deep water and by at least 75-80% in chest to neck deep water.  Viscosity of the water is needed for resistance of strengthening. Water current perturbations provides challenge to standing balance requiring increased core activation.  DATE: 05/09/2022  UBE level 1.0 x3 min each direction with PT present to discuss status Cervical rotation x10 reps Cervical retraction into ball 2x10 Standing rows and shoulder extension with green tband 2x10 Standing shoulder ER and horizontal abduction with green tband 2x10 Manual Therapy:  soft tissue mobilization to cervical paraspinals and upper traps, manual trigger point release to upper traps, suboccipital release and manual traction.  Passive stretch into sidebending Mechanical traction: 15/5# 60 seconds/10 seconds x12 minutes                                                                                                                         DATE: 05/07/2022  Pt seen for aquatic therapy today.  Treatment took place in water 3.25-4.5 ft in depth at the [facility]. Temp of water was 91.  Pt entered/exited the pool via stairs  independently with single rail.  * walking forward/backward with reciprocal arm swing; side stepping with bilat shoulder add/abdct (no floats) * standard stance with bilat shoulder horiz abdct/ addct; L's; W's; bilat shoulder addct with rainbow hand floats x 10; bilat tricep push downs with rainbow -> yellow hand floats (cues for scap depression) ; alternating shoulder flex/ext (0-~80) with rainbow hand floats x 5 each * wall push ups with cues for elbows in at side, and scap depression x 5 - stopped due to poor form and switched to next exercise * staggered stance with row with yellow hand floats on surface -> kick board row x 10 with each foot forward * supported back float with noodle under arms/legs/ and nekdoodle for decompression of spine x 3 min Pt requires the buoyancy and hydrostatic pressure of water for support, and to offload joints by unweighting joint load by at least 50 % in navel deep water and by at least 75-80% in chest to neck deep water.  Viscosity of the water is needed for resistance of strengthening. Water current perturbations provides challenge to standing balance requiring increased core activation.   DATE: 05/03/2022  UBE level 1.0 x3 min each direction with PT present to discuss status Cervical rotation x10 reps Cervical retraction into ball 2x10 Seated with ball against ball performing extension 2x10 Standing at stairs hamstring stretch 2x20 sec Standing rows and shoulder extension with green tband 2x10 Standing shoulder ER and horizontal abduction with green tband 2x10 Manual Therapy:  soft tissue mobilization to cervical paraspinals and upper traps, manual trigger point release to upper traps, suboccipital release and manual traction.   PATIENT EDUCATION:  Education details: Issued HEP Person educated: Patient Education method: Explanation, Demonstration, and Handouts Education comprehension: verbalized understanding and returned demonstration  HOME  EXERCISE  PROGRAM: Access Code: [email or web address] URL: [email or web address] Date: 05/01/2022 Prepared by: Shelby Dubin Menke  Exercises - Seated Cervical Retraction  - 3 x daily - 7 x weekly - 1 sets - 10 reps - 3-5 sec hold - Seated Cervical Extension AROM  - 1 x daily - 7 x weekly - 2 sets - 10 reps - Seated Cervical Sidebending AROM  - 3 x daily - 7 x weekly - 1 sets - 3 reps - 20 hold - Seated Cervical Rotation AROM  - 3 x daily - 7 x weekly - 1 sets - 3 reps - 20 hold - Seated Correct Posture  - 1 x daily - 7 x weekly - 3 sets - 10 reps - Standing Row with Anchored Resistance  - 1 x daily - 7 x weekly - 3 sets - 10 reps - Single Arm Shoulder Extension with Anchored Resistance  - 1 x daily - 7 x weekly - 3 sets - 10 reps - Wall Push Up  - 1 x daily - 7 x weekly - 2 sets - 10 reps  ASSESSMENT:  CLINICAL IMPRESSION: Dialed back intensity of exercises today,for improved tolerance.   Will add additional sets of LE exercises (for LTG#4) and single leg balance (LTG#5) next session.  He reported mild increase in Rt low back discomfort with forward/backward steps with resistance bell; reduced with change in exercise.Otherwise, he tolerated session well. Minor cues for posture and scapula positioning (more neutral vs forward)  Patient will benefit from skilled PT to address the below impairments and improve overall function.    OBJECTIVE IMPAIRMENTS: Abnormal gait, decreased balance, decreased strength, increased muscle spasms, impaired flexibility, postural dysfunction, and pain.   ACTIVITY LIMITATIONS: carrying, lifting, and bending  PARTICIPATION LIMITATIONS: community activity  PERSONAL FACTORS: Past/current experiences, Time since onset of injury/illness/exacerbation, and 1-2 comorbidities: OA, Diabetes  are also affecting patient's functional outcome.   REHAB POTENTIAL: Good  CLINICAL DECISION MAKING: Evolving/moderate complexity  EVALUATION COMPLEXITY: Moderate   GOALS: Goals reviewed  with patient? Yes  SHORT TERM GOALS: Target date: 05/17/2022  Pt will be independent with initial HEP. Baseline:  Goal status: IN PROGRESS  2.  Patient will report at least 30% improvement in symptoms/decreased pain since starting therapy. Baseline: 4/10 Goal status: INITIAL   LONG TERM GOALS: Target date: 06/21/2022  Pt will be independent with advanced HEP and knowledge of independent progression. Baseline:  Goal status: INITIAL  2.  Patient will increase FOTO to at least 57% to demonstrate improvements in functional mobility. Baseline: 53% Goal status: INITIAL  3.  Patient will increase cervical A/ROM to [facility] to allow him to drive without any increased strain to neck. Baseline: see chart Goal status: INITIAL  4.  Patient will increase left shoulder and hip strength to at least 5-/5 throughout to allow him to lift objects without difficulty. Baseline: 4 to 4+/5 Goal status: INITIAL  5.  Patient will increase bilateral single leg stance to at least 20 seconds to decrease risk of falling. Baseline: less than 5 sec on each side Goal status: INITIAL   PLAN:  PT FREQUENCY: 2x/week  PT DURATION: 8 weeks  PLANNED INTERVENTIONS: Therapeutic exercises, Therapeutic activity, Neuromuscular re-education, Balance training, Gait training, Patient/Family education, Self Care, Joint mobilization, Joint manipulation, Aquatic Therapy, Dry Needling, Electrical stimulation, Spinal manipulation, Spinal mobilization, Cryotherapy, Moist heat, Taping, Traction, Ultrasound, Ionotophoresis '4mg'$ /ml Dexamethasone, Manual therapy, and Re-evaluation  PLAN FOR NEXT SESSION: assess and progress HEP as indicated, strengthening, flexibility, aquatic PT, balance  Kerin Perna, PTA 05/14/22 3:26 PM [facility] [address] [place], [place], [address] Phone: [phone]   Fax:  [phone]

## 2022-05-16 ENCOUNTER — Ambulatory Visit: Payer: Medicare HMO | Attending: Neurological Surgery

## 2022-05-16 DIAGNOSIS — M6281 Muscle weakness (generalized): Secondary | ICD-10-CM | POA: Diagnosis present

## 2022-05-16 DIAGNOSIS — M79602 Pain in left arm: Secondary | ICD-10-CM

## 2022-05-16 DIAGNOSIS — M542 Cervicalgia: Secondary | ICD-10-CM

## 2022-05-16 DIAGNOSIS — R2689 Other abnormalities of gait and mobility: Secondary | ICD-10-CM | POA: Diagnosis present

## 2022-05-16 DIAGNOSIS — R252 Cramp and spasm: Secondary | ICD-10-CM

## 2022-05-16 NOTE — Therapy (Signed)
OUTPATIENT PHYSICAL THERAPY TREATMENT NOTE   Patient Name: Mark Travis MRN: [id number] DOB:[birth year], 72 y.o., male Today's Date: 05/16/2022  END OF SESSION:  PT End of Session - 05/16/22 1057     Visit Number 6    Date for PT Re-Evaluation 06/21/22    Authorization Type [employer] (no auth required)    PT Start Time 1015    PT Stop Time 1102    PT Time Calculation (min) 47 min    Activity Tolerance Patient tolerated treatment well    Behavior During Therapy WFL for tasks assessed/performed               Past Medical History:  Diagnosis Date   Chronic low back pain    Decreased hearing    Diverticulosis    Emphysema (subcutaneous) (surgical) resulting from a procedure    Family history of cancer    Fatty liver    Glaucoma    Hiatal hernia    IBS (irritable bowel syndrome)    OSA on CPAP    Prostatitis    Seasonal allergies    Past Surgical History:  Procedure Laterality Date   COLONOSCOPY  10/2012   L trigger finger release     Patient Active Problem List   Diagnosis Date Noted   Genetic testing 05/03/2022   Family history of cancer 03/26/2022   Myalgia due to statin 03/15/2022   Coronary artery calcification 03/15/2022   Aortic atherosclerosis ([place]) 03/15/2022   Chest pain of uncertain etiology 92/44/6286   Right carotid bruit 08/28/2021   Claudication of both lower extremities ([place]) 08/28/2021   Tobacco abuse 08/28/2021   Hypertension associated with diabetes ([place]) 08/28/2021   Allergic rhinitis due to animal (cat) (dog) hair and dander 08/07/2021   Allergic rhinitis due to pollen 08/07/2021   Chronic allergic conjunctivitis 08/07/2021   Chronic rhinitis 08/07/2021   Multiple thyroid nodules 05/09/2021   Nocturia 05/23/2020   Right knee pain 04/27/2020   Cervical radiculopathy 12/24/2019   Pulmonary nodule less than 6 cm determined by computed tomography of lung 11/25/2019   Hyperlipidemia 11/09/2019   Ptosis of right eyelid 05/27/2019    Deviated septum 03/31/2019   Type 2 diabetes mellitus ([place]) 03/23/2019   Chronic low back pain 03/19/2019   Fatty liver 03/19/2019   Other irritable bowel syndrome 03/19/2019   Other specified glaucoma 03/19/2019   Seasonal allergies 03/19/2019   Pulmonary emphysema ([place]) 12/09/2018   Loud snoring 12/09/2018   OSA and COPD overlap syndrome ([place]) 12/09/2018   Elevated prostate specific antigen (PSA) 05/27/2018   Patellofemoral syndrome of left knee 04/16/2017   Patellofemoral syndrome of right knee 04/16/2017   Primary open angle glaucoma of both eyes, moderate stage 11/11/2015   Cataract, nuclear, bilateral 09/18/2013   Degenerative progressive high myopia, bilateral 09/18/2013    PCP: Heywood Bene, PA-C  REFERRING PROVIDER: Kristeen Miss, MD  REFERRING DIAG: [phone] (ICD-10-CM) - Other spondylosis, cervical region  THERAPY DIAG:  Cervicalgia  Cramp and spasm  Muscle weakness (generalized)  Pain in left arm  Other abnormalities of gait and mobility  Rationale for Evaluation and Treatment: Rehabilitation  ONSET DATE: chronic, but worse the past year  SUBJECTIVE:  SUBJECTIVE STATEMENT: Pt reports that traction made his neck sore last session.  He wants to do again but with less force.    PERTINENT HISTORY:  Diabetes, Hypertension, Glaucoma, OA  PAIN:  Are you having pain?Yes : NPRS scale: 1/10 Pain location: post neck   Pain description: tingling/aching Aggravating factors: sleeping, overhead activities Relieving factors: traction, thermal modalities  PRECAUTIONS: None  WEIGHT BEARING RESTRICTIONS: No  FALLS:  Has patient fallen in last 6 months? Yes. Number of falls 1 due to a vasovagal reaction when waking up and getting up to go to the  bathroom  LIVING ENVIRONMENT: Lives with: lives with their spouse Lives in: House/apartment Stairs: Yes: Internal: 15 steps; on right going up Has following equipment at home:  has purchased a shower grab bar, but has not installed yet  OCCUPATION: part-time tax Press photographer, works primarily during tax season  PLOF: Independent and Leisure: used to enjoy boating, photography, reading, gaming on computer  PATIENT GOALS: To have more consistency on decreased pain level.  NEXT MD VISIT: July 2024  OBJECTIVE:   DIAGNOSTIC FINDINGS:  Older MRI and Cervical Radiographs in 2022, will likely need updated pending visit in July 2024.  PATIENT SURVEYS:  Eval:  FOTO 53% (projected 57% by visit 11)  COGNITION: Overall cognitive status: Within functional limits for tasks assessed  SENSATION: Eval:  reports tingling down left arm and occasionally left leg  POSTURE: rounded shoulders and forward head  PALPATION: Eval:  Mild tightness and spasms with cervical paraspinals and upper traps   CERVICAL ROM:   Active ROM A/PROM (deg) eval A/ROM 05/16/22  Flexion 50   Extension 30   Right lateral flexion 20 28  Left lateral flexion 15 20  Right rotation 40 50  Left rotation 45 50   (Blank rows = not tested)  UPPER EXTREMITY ROM:  WFL  UPPER EXTREMITY MMT: Eval:   Right UE is 5/5, Left shoulder is 4+ to 5-/5 Right hip strength WFL, Left hip is 4 to 4+/5 FUNCTIONAL TESTS:  Eval: 5 times sit to stand: 14.9 sec Single Leg Stance:  Right- 4.7 sec, Left- 2.7 sec  TODAY'S TREATMENT:        DATE: 05/16/2022  UBE level 1.0 x3 min each direction with PT present to discuss status Cervical rotation 3x20 seconds , sidbending and flexion 2x20 seconds  Supine seratus punches: 4# 2x10 Green loop around wrists: vectors Rt and Lt 5 each directions Manual Therapy:  soft tissue mobilization to cervical paraspinals and upper traps, manual trigger point release to upper traps, suboccipital release and  manual traction.  Passive stretch into sidebending Mechanical traction: 13/5# 60 seconds/10 seconds x12 minutes  DATE: 05/14/2022  Pt seen for aquatic therapy today.  Treatment took place in water 3.25-4.5 ft in depth at the [facility]. Temp of water was 91.  Pt entered/exited the pool via stairs independently with single rail.  * marching forward/backward with reciprocal arm swing; side stepping with bilat shoulder add/abdct (no floats) * standard stance with bilat shoulder horiz abdct/ addct; L's * return to walking forward with arm swing * TrA set with straight arm noodle pull down, with focus on scap depression with slow eccentric return.  * bilat tricep push down with rainbow hand floats x 10; bilat shoulder addct with rainbow hand floats x 10 * standard stance with kick board push / pull,cues for scap retraction and increased speed; single resistance bell with reciprocal arm swing, and small circles at side * forward  step with single arm row ( with resistance bell) and return to neutral x 10 each LE   * Holding low drain:  hip abdct x 10 each; hip ext to toe touch x 10 each; bilat heel raises x12 * SLS R/L - 10s; requires skulling for each side to steady after 2 seconds.    Pt requires the buoyancy and hydrostatic pressure of water for support, and to offload joints by unweighting joint load by at least 50 % in navel deep water and by at least 75-80% in chest to neck deep water.  Viscosity of the water is needed for resistance of strengthening. Water current perturbations provides challenge to standing balance requiring increased core activation.  DATE: 05/09/2022  UBE level 1.0 x3 min each direction with PT present to discuss status Cervical rotation x10 reps Cervical retraction into ball 2x10 Standing rows and shoulder extension with green tband 2x10 Standing shoulder ER and horizontal abduction with green tband 2x10 Manual Therapy:  soft tissue mobilization to cervical  paraspinals and upper traps, manual trigger point release to upper traps, suboccipital release and manual traction.  Passive stretch into sidebending Mechanical traction: 15/5# 60 seconds/10 seconds x12 minutes                                                                                                                         PATIENT EDUCATION:  Education details: Issued HEP Person educated: Patient Education method: Explanation, Demonstration, and Handouts Education comprehension: verbalized understanding and returned demonstration  HOME EXERCISE PROGRAM: Access Code: [email or web address] URL: [email or web address] Date: 05/01/2022 Prepared by: Shelby Dubin Menke  Exercises - Seated Cervical Retraction  - 3 x daily - 7 x weekly - 1 sets - 10 reps - 3-5 sec hold - Seated Cervical Extension AROM  - 1 x daily - 7 x weekly - 2 sets - 10 reps - Seated Cervical Sidebending AROM  - 3 x daily - 7 x weekly - 1 sets - 3 reps - 20 hold - Seated Cervical Rotation AROM  - 3 x daily - 7 x weekly - 1 sets - 3 reps - 20 hold - Seated Correct Posture  - 1 x daily - 7 x weekly - 3 sets - 10 reps - Standing Row with Anchored Resistance  - 1 x daily - 7 x weekly - 3 sets - 10 reps - Single Arm Shoulder Extension with Anchored Resistance  - 1 x daily - 7 x weekly - 3 sets - 10 reps - Wall Push Up  - 1 x daily - 7 x weekly - 2 sets - 10 reps  ASSESSMENT:  CLINICAL IMPRESSION: Pt reports some soreness in the neck after traction last session so we reduced this today.  Pt is independent and compliant with HEP for flexibility and strength. Pt fatigues with scapular stabilization exercises.  Pt with improved cervical rotation in all directions.  Pt with tension in bil neck and responds well to manual therapy for mobility.  Patient  will benefit from skilled PT to address the below impairments and improve overall function.    OBJECTIVE IMPAIRMENTS: Abnormal gait, decreased balance, decreased strength, increased  muscle spasms, impaired flexibility, postural dysfunction, and pain.   ACTIVITY LIMITATIONS: carrying, lifting, and bending  PARTICIPATION LIMITATIONS: community activity  PERSONAL FACTORS: Past/current experiences, Time since onset of injury/illness/exacerbation, and 1-2 comorbidities: OA, Diabetes  are also affecting patient's functional outcome.   REHAB POTENTIAL: Good  CLINICAL DECISION MAKING: Evolving/moderate complexity  EVALUATION COMPLEXITY: Moderate   GOALS: Goals reviewed with patient? Yes  SHORT TERM GOALS: Target date: 05/17/2022  Pt will be independent with initial HEP. Baseline:  Goal status: IN PROGRESS  2.  Patient will report at least 30% improvement in symptoms/decreased pain since starting therapy. Baseline: 4/10 Goal status: INITIAL   LONG TERM GOALS: Target date: 06/21/2022  Pt will be independent with advanced HEP and knowledge of independent progression. Baseline:  Goal status: INITIAL  2.  Patient will increase FOTO to at least 57% to demonstrate improvements in functional mobility. Baseline: 53% Goal status: INITIAL  3.  Patient will increase cervical A/ROM to [facility] to allow him to drive without any increased strain to neck. Baseline: see chart Goal status: INITIAL  4.  Patient will increase left shoulder and hip strength to at least 5-/5 throughout to allow him to lift objects without difficulty. Baseline: 4 to 4+/5 Goal status: INITIAL  5.  Patient will increase bilateral single leg stance to at least 20 seconds to decrease risk of falling. Baseline: less than 5 sec on each side Goal status: INITIAL   PLAN:  PT FREQUENCY: 2x/week  PT DURATION: 8 weeks  PLANNED INTERVENTIONS: Therapeutic exercises, Therapeutic activity, Neuromuscular re-education, Balance training, Gait training, Patient/Family education, Self Care, Joint mobilization, Joint manipulation, Aquatic Therapy, Dry Needling, Electrical stimulation, Spinal manipulation, Spinal  mobilization, Cryotherapy, Moist heat, Taping, Traction, Ultrasound, Ionotophoresis '4mg'$ /ml Dexamethasone, Manual therapy, and Re-evaluation  PLAN FOR NEXT SESSION: assess and progress HEP as indicated, strengthening, flexibility, aquatic PT, balance.  See how pt does with reduced traction weight.  Sigurd Sos, PT 05/16/22 10:59 AM   [facility] [place], [place], [address] Phone: [phone]   Fax:  [phone]

## 2022-05-21 ENCOUNTER — Encounter: Payer: Self-pay | Admitting: Rehabilitative and Restorative Service Providers"

## 2022-05-21 ENCOUNTER — Ambulatory Visit: Payer: Medicare HMO | Admitting: Rehabilitative and Restorative Service Providers"

## 2022-05-21 DIAGNOSIS — M542 Cervicalgia: Secondary | ICD-10-CM | POA: Diagnosis not present

## 2022-05-21 DIAGNOSIS — R2689 Other abnormalities of gait and mobility: Secondary | ICD-10-CM

## 2022-05-21 DIAGNOSIS — R252 Cramp and spasm: Secondary | ICD-10-CM

## 2022-05-21 DIAGNOSIS — M6281 Muscle weakness (generalized): Secondary | ICD-10-CM

## 2022-05-21 DIAGNOSIS — M79602 Pain in left arm: Secondary | ICD-10-CM

## 2022-05-21 NOTE — Therapy (Signed)
OUTPATIENT PHYSICAL THERAPY TREATMENT NOTE   Patient Name: Mark Travis MRN: [id number] DOB:June 22, 1950, 72 y.o., male Today's Date: 05/21/2022  END OF SESSION:  PT End of Session - 05/21/22 1009     Visit Number 7    Date for PT Re-Evaluation 06/21/22    Authorization Type [employer]    Authorization Time Period 05/01/2022 - 06/21/2022    Authorization - Visit Number 7    Authorization - Number of Visits 10    Progress Note Due on Visit 10    PT Start Time 1007    PT Stop Time 1045    PT Time Calculation (min) 38 min    Activity Tolerance Patient tolerated treatment well    Behavior During Therapy WFL for tasks assessed/performed               Past Medical History:  Diagnosis Date   Chronic low back pain    Decreased hearing    Diverticulosis    Emphysema (subcutaneous) (surgical) resulting from a procedure    Family history of cancer    Fatty liver    Glaucoma    Hiatal hernia    IBS (irritable bowel syndrome)    OSA on CPAP    Prostatitis    Seasonal allergies    Past Surgical History:  Procedure Laterality Date   COLONOSCOPY  10/2012   L trigger finger release     Patient Active Problem List   Diagnosis Date Noted   Genetic testing 05/03/2022   Family history of cancer 03/26/2022   Myalgia due to statin 03/15/2022   Coronary artery calcification 03/15/2022   Aortic atherosclerosis ([place]) 03/15/2022   Chest pain of uncertain etiology 27/78/2423   Right carotid bruit 08/28/2021   Claudication of both lower extremities ([place]) 08/28/2021   Tobacco abuse 08/28/2021   Hypertension associated with diabetes ([place]) 08/28/2021   Allergic rhinitis due to animal (cat) (dog) hair and dander 08/07/2021   Allergic rhinitis due to pollen 08/07/2021   Chronic allergic conjunctivitis 08/07/2021   Chronic rhinitis 08/07/2021   Multiple thyroid nodules 05/09/2021   Nocturia 05/23/2020   Right knee pain 04/27/2020   Cervical radiculopathy 12/24/2019   Pulmonary nodule  less than 6 cm determined by computed tomography of lung 11/25/2019   Hyperlipidemia 11/09/2019   Ptosis of right eyelid 05/27/2019   Deviated septum 03/31/2019   Type 2 diabetes mellitus ([place]) 03/23/2019   Chronic low back pain 03/19/2019   Fatty liver 03/19/2019   Other irritable bowel syndrome 03/19/2019   Other specified glaucoma 03/19/2019   Seasonal allergies 03/19/2019   Pulmonary emphysema ([place]) 12/09/2018   Loud snoring 12/09/2018   OSA and COPD overlap syndrome ([place]) 12/09/2018   Elevated prostate specific antigen (PSA) 05/27/2018   Patellofemoral syndrome of left knee 04/16/2017   Patellofemoral syndrome of right knee 04/16/2017   Primary open angle glaucoma of both eyes, moderate stage 11/11/2015   Cataract, nuclear, bilateral 09/18/2013   Degenerative progressive high myopia, bilateral 09/18/2013    PCP: Heywood Bene, PA-C  REFERRING PROVIDER: Kristeen Miss, MD  REFERRING DIAG: [phone] (ICD-10-CM) - Other spondylosis, cervical region  THERAPY DIAG:  Cervicalgia  Cramp and spasm  Muscle weakness (generalized)  Pain in left arm  Other abnormalities of gait and mobility  Rationale for Evaluation and Treatment: Rehabilitation  ONSET DATE: chronic, but worse the past year  SUBJECTIVE:  SUBJECTIVE STATEMENT: Pt reports being at least 30% better since initial evaluation.  Patient states that traction last visit did better than the previous.  PERTINENT HISTORY:  Diabetes, Hypertension, Glaucoma, OA  PAIN:  Are you having pain?Yes : NPRS scale: 1/10 Pain location: post neck   Pain description: tingling/aching Aggravating factors: sleeping, overhead activities Relieving factors: traction, thermal modalities  PRECAUTIONS: None  WEIGHT BEARING  RESTRICTIONS: No  FALLS:  Has patient fallen in last 6 months? Yes. Number of falls 1 due to a vasovagal reaction when waking up and getting up to go to the bathroom  LIVING ENVIRONMENT: Lives with: lives with their spouse Lives in: House/apartment Stairs: Yes: Internal: 15 steps; on right going up Has following equipment at home:  has purchased a shower grab bar, but has not installed yet  OCCUPATION: part-time tax Press photographer, works primarily during tax season  PLOF: Independent and Leisure: used to enjoy boating, photography, reading, gaming on computer  PATIENT GOALS: To have more consistency on decreased pain level.  NEXT MD VISIT: July 2024  OBJECTIVE:   DIAGNOSTIC FINDINGS:  Older MRI and Cervical Radiographs in 2022, will likely need updated pending visit in July 2024.  PATIENT SURVEYS:  Eval:  FOTO 53% (projected 57% by visit 11)  COGNITION: Overall cognitive status: Within functional limits for tasks assessed  SENSATION: Eval:  reports tingling down left arm and occasionally left leg  POSTURE: rounded shoulders and forward head  PALPATION: Eval:  Mild tightness and spasms with cervical paraspinals and upper traps   CERVICAL ROM:   Active ROM A/PROM (deg) eval A/ROM 05/16/22  Flexion 50   Extension 30   Right lateral flexion 20 28  Left lateral flexion 15 20  Right rotation 40 50  Left rotation 45 50   (Blank rows = not tested)  UPPER EXTREMITY ROM:  WFL  UPPER EXTREMITY MMT: Eval:   Right UE is 5/5, Left shoulder is 4+ to 5-/5 Right hip strength WFL, Left hip is 4 to 4+/5  FUNCTIONAL TESTS:  Eval: 5 times sit to stand: 14.9 sec Single Leg Stance:  Right- 4.7 sec, Left- 2.7 sec  TODAY'S TREATMENT:         DATE: 05/21/2022  UBE level 1.0 x3 min each direction with PT present to discuss status Cervical rotation 3x20 seconds , sidbending and flexion 2x20 seconds  Standing 3 way scapular stabilization with green loop x5 bilat Seated with ball  against back:  performing extension x10 Standing single leg stance 2x10 sec each Counter push-up 2x10 Manual Therapy:  soft tissue mobilization to cervical paraspinals and upper traps, manual trigger point release to upper traps, suboccipital release   DATE: 05/16/2022  UBE level 1.0 x3 min each direction with PT present to discuss status Cervical rotation 3x20 seconds , sidbending and flexion 2x20 seconds  Supine seratus punches: 4# 2x10 Green loop around wrists: vectors Rt and Lt 5 each directions Manual Therapy:  soft tissue mobilization to cervical paraspinals and upper traps, manual trigger point release to upper traps, suboccipital release and manual traction.  Passive stretch into sidebending Mechanical traction: 13/5# 60 seconds/10 seconds x12 minutes  DATE: 05/14/2022  Pt seen for aquatic therapy today.  Treatment took place in water 3.25-4.5 ft in depth at the [facility]. Temp of water was 91.  Pt entered/exited the pool via stairs independently with single rail.  * marching forward/backward with reciprocal arm swing; side stepping with bilat shoulder add/abdct (no floats) * standard stance with bilat  shoulder horiz abdct/ addct; L's * return to walking forward with arm swing * TrA set with straight arm noodle pull down, with focus on scap depression with slow eccentric return.  * bilat tricep push down with rainbow hand floats x 10; bilat shoulder addct with rainbow hand floats x 10 * standard stance with kick board push / pull,cues for scap retraction and increased speed; single resistance bell with reciprocal arm swing, and small circles at side * forward step with single arm row ( with resistance bell) and return to neutral x 10 each LE   * Holding low drain:  hip abdct x 10 each; hip ext to toe touch x 10 each; bilat heel raises x12 * SLS R/L - 10s; requires skulling for each side to steady after 2 seconds.    Pt requires the buoyancy and hydrostatic pressure of  water for support, and to offload joints by unweighting joint load by at least 50 % in navel deep water and by at least 75-80% in chest to neck deep water.  Viscosity of the water is needed for resistance of strengthening. Water current perturbations provides challenge to standing balance requiring increased core activation.                                                                                                                          PATIENT EDUCATION:  Education details: Issued HEP Person educated: Patient Education method: Explanation, Demonstration, and Handouts Education comprehension: verbalized understanding and returned demonstration  HOME EXERCISE PROGRAM: Access Code: [email or web address] URL: [email or web address] Date: 05/01/2022 Prepared by: Shelby Dubin Jakolby Sedivy  Exercises - Seated Cervical Retraction  - 3 x daily - 7 x weekly - 1 sets - 10 reps - 3-5 sec hold - Seated Cervical Extension AROM  - 1 x daily - 7 x weekly - 2 sets - 10 reps - Seated Cervical Sidebending AROM  - 3 x daily - 7 x weekly - 1 sets - 3 reps - 20 hold - Seated Cervical Rotation AROM  - 3 x daily - 7 x weekly - 1 sets - 3 reps - 20 hold - Seated Correct Posture  - 1 x daily - 7 x weekly - 3 sets - 10 reps - Standing Row with Anchored Resistance  - 1 x daily - 7 x weekly - 3 sets - 10 reps - Single Arm Shoulder Extension with Anchored Resistance  - 1 x daily - 7 x weekly - 3 sets - 10 reps - Wall Push Up  - 1 x daily - 7 x weekly - 2 sets - 10 reps  ASSESSMENT:  CLINICAL IMPRESSION: Mr Talavera presents to skilled PT reporting that he 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  Pt states that he did have some soreness following aquatics, but he feels like it was a good workout.  Patient has cervical traction at home and  states that he uses it approx every other day.  Patient with trigger points noted primarily  in upper trap region.  Patient reports relief of stress/tightness with manual therapy.     OBJECTIVE IMPAIRMENTS: Abnormal gait, decreased balance, decreased strength, increased muscle spasms, impaired flexibility, postural dysfunction, and pain.   ACTIVITY LIMITATIONS: carrying, lifting, and bending  PARTICIPATION LIMITATIONS: community activity  PERSONAL FACTORS: Past/current experiences, Time since onset of injury/illness/exacerbation, and 1-2 comorbidities: OA, Diabetes  are also affecting patient's functional outcome.   REHAB POTENTIAL: Good  CLINICAL DECISION MAKING: Evolving/moderate complexity  EVALUATION COMPLEXITY: Moderate   GOALS: Goals reviewed with patient? Yes  SHORT TERM GOALS: Target date: 05/17/2022  Pt will be independent with initial HEP. Baseline:  Goal status: MET  2.  Patient will report at least 30% improvement in symptoms/decreased pain since starting therapy. Baseline: 4/10 Goal status: MET   LONG TERM GOALS: Target date: 06/21/2022  Pt will be independent with advanced HEP and knowledge of independent progression. Baseline:  Goal status: IN PROGRESS  2.  Patient will increase FOTO to at least 57% to demonstrate improvements in functional mobility. Baseline: 53% Goal status: INITIAL  3.  Patient will increase cervical A/ROM to [facility] to allow him to drive without any increased strain to neck. Baseline: see chart Goal status: IN PROGRESS  4.  Patient will increase left shoulder and hip strength to at least 5-/5 throughout to allow him to lift objects without difficulty. Baseline: 4 to 4+/5 Goal status: INITIAL  5.  Patient will increase bilateral single leg stance to at least 20 seconds to decrease risk of falling. Baseline: less than 5 sec on each side Goal status: IN PROGRESS   PLAN:  PT FREQUENCY: 2x/week  PT DURATION: 8 weeks  PLANNED INTERVENTIONS: Therapeutic exercises, Therapeutic activity, Neuromuscular re-education, Balance  training, Gait training, Patient/Family education, Self Care, Joint mobilization, Joint manipulation, Aquatic Therapy, Dry Needling, Electrical stimulation, Spinal manipulation, Spinal mobilization, Cryotherapy, Moist heat, Taping, Traction, Ultrasound, Ionotophoresis '4mg'$ /ml Dexamethasone, Manual therapy, and Re-evaluation  PLAN FOR NEXT SESSION: assess and progress HEP as indicated, strengthening, flexibility, aquatic PT, balance, next land session, try Nustep instead of UBE    Eran Windish, PT 05/21/22 10:56 AM  [facility] [address] [place], [place] [address] Phone # [phone] Fax [phone]

## 2022-05-22 ENCOUNTER — Encounter: Payer: Self-pay | Admitting: Internal Medicine

## 2022-05-22 NOTE — Telephone Encounter (Signed)
Left a message to call back

## 2022-05-23 ENCOUNTER — Encounter (HOSPITAL_BASED_OUTPATIENT_CLINIC_OR_DEPARTMENT_OTHER): Payer: Self-pay | Admitting: Physical Therapy

## 2022-05-23 ENCOUNTER — Ambulatory Visit (HOSPITAL_BASED_OUTPATIENT_CLINIC_OR_DEPARTMENT_OTHER): Payer: Medicare HMO | Attending: Neurological Surgery | Admitting: Physical Therapy

## 2022-05-23 DIAGNOSIS — R252 Cramp and spasm: Secondary | ICD-10-CM | POA: Insufficient documentation

## 2022-05-23 DIAGNOSIS — M542 Cervicalgia: Secondary | ICD-10-CM | POA: Insufficient documentation

## 2022-05-23 DIAGNOSIS — M6281 Muscle weakness (generalized): Secondary | ICD-10-CM | POA: Diagnosis present

## 2022-05-23 NOTE — Therapy (Signed)
OUTPATIENT PHYSICAL THERAPY TREATMENT NOTE   Patient Name: Mark Travis MRN: [id number] DOB:[birth year], 72 y.o., male Today's Date: 05/23/2022  END OF SESSION:  PT End of Session - 05/23/22 1630     Visit Number 8    Date for PT Re-Evaluation 06/21/22    Authorization Type [employer]    Authorization Time Period 05/01/2022 - 06/21/2022    Authorization - Visit Number 8    Authorization - Number of Visits 10    Progress Note Due on Visit 10    PT Start Time 1618    PT Stop Time 1656    PT Time Calculation (min) 38 min    Activity Tolerance Patient tolerated treatment well    Behavior During Therapy WFL for tasks assessed/performed               Past Medical History:  Diagnosis Date   Chronic low back pain    Decreased hearing    Diverticulosis    Emphysema (subcutaneous) (surgical) resulting from a procedure    Family history of cancer    Fatty liver    Glaucoma    Hiatal hernia    IBS (irritable bowel syndrome)    OSA on CPAP    Prostatitis    Seasonal allergies    Past Surgical History:  Procedure Laterality Date   COLONOSCOPY  10/2012   L trigger finger release     Patient Active Problem List   Diagnosis Date Noted   Genetic testing 05/03/2022   Family history of cancer 03/26/2022   Myalgia due to statin 03/15/2022   Coronary artery calcification 03/15/2022   Aortic atherosclerosis ([place]) 03/15/2022   Chest pain of uncertain etiology 99/37/1696   Right carotid bruit 08/28/2021   Claudication of both lower extremities ([place]) 08/28/2021   Tobacco abuse 08/28/2021   Hypertension associated with diabetes ([place]) 08/28/2021   Allergic rhinitis due to animal (cat) (dog) hair and dander 08/07/2021   Allergic rhinitis due to pollen 08/07/2021   Chronic allergic conjunctivitis 08/07/2021   Chronic rhinitis 08/07/2021   Multiple thyroid nodules 05/09/2021   Nocturia 05/23/2020   Right knee pain 04/27/2020   Cervical radiculopathy 12/24/2019   Pulmonary nodule  less than 6 cm determined by computed tomography of lung 11/25/2019   Hyperlipidemia 11/09/2019   Ptosis of right eyelid 05/27/2019   Deviated septum 03/31/2019   Type 2 diabetes mellitus ([place]) 03/23/2019   Chronic low back pain 03/19/2019   Fatty liver 03/19/2019   Other irritable bowel syndrome 03/19/2019   Other specified glaucoma 03/19/2019   Seasonal allergies 03/19/2019   Pulmonary emphysema ([place]) 12/09/2018   Loud snoring 12/09/2018   OSA and COPD overlap syndrome ([place]) 12/09/2018   Elevated prostate specific antigen (PSA) 05/27/2018   Patellofemoral syndrome of left knee 04/16/2017   Patellofemoral syndrome of right knee 04/16/2017   Primary open angle glaucoma of both eyes, moderate stage 11/11/2015   Cataract, nuclear, bilateral 09/18/2013   Degenerative progressive high myopia, bilateral 09/18/2013    PCP: Heywood Bene, PA-C  REFERRING PROVIDER: Kristeen Miss, MD  REFERRING DIAG: [phone] (ICD-10-CM) - Other spondylosis, cervical region  THERAPY DIAG:  Cervicalgia  Cramp and spasm  Muscle weakness (generalized)  Rationale for Evaluation and Treatment: Rehabilitation  ONSET DATE: chronic, but worse the past year  SUBJECTIVE:  SUBJECTIVE STATEMENT: Pt reports he had a massage yesterday.  No other changes since last visit.   PERTINENT HISTORY:  Diabetes, Hypertension, Glaucoma, OA  PAIN:  Are you having pain?Yes : NPRS scale: 1/10 Pain location: post neck   Pain description: "annoying".  Aggravating factors: sleeping, overhead activities Relieving factors: traction, thermal modalities  PRECAUTIONS: None  WEIGHT BEARING RESTRICTIONS: No  FALLS:  Has patient fallen in last 6 months? Yes. Number of falls 1 due to a vasovagal reaction when waking up  and getting up to go to the bathroom  LIVING ENVIRONMENT: Lives with: lives with their spouse Lives in: House/apartment Stairs: Yes: Internal: 15 steps; on right going up Has following equipment at home:  has purchased a shower grab bar, but has not installed yet  OCCUPATION: part-time tax Press photographer, works primarily during tax season  PLOF: Independent and Leisure: used to enjoy boating, photography, reading, gaming on computer  PATIENT GOALS: To have more consistency on decreased pain level.  NEXT MD VISIT: July 2024  OBJECTIVE:   DIAGNOSTIC FINDINGS:  Older MRI and Cervical Radiographs in 2022, will likely need updated pending visit in July 2024.  PATIENT SURVEYS:  Eval:  FOTO 53% (projected 57% by visit 11)  COGNITION: Overall cognitive status: Within functional limits for tasks assessed  SENSATION: Eval:  reports tingling down left arm and occasionally left leg  POSTURE: rounded shoulders and forward head  PALPATION: Eval:  Mild tightness and spasms with cervical paraspinals and upper traps   CERVICAL ROM:   Active ROM A/PROM (deg) eval A/ROM 05/16/22  Flexion 50   Extension 30   Right lateral flexion 20 28  Left lateral flexion 15 20  Right rotation 40 50  Left rotation 45 50   (Blank rows = not tested)  UPPER EXTREMITY ROM:  WFL  UPPER EXTREMITY MMT: Eval:   Right UE is 5/5, Left shoulder is 4+ to 5-/5 Right hip strength WFL, Left hip is 4 to 4+/5  FUNCTIONAL TESTS:  Eval: 5 times sit to stand: 14.9 sec Single Leg Stance:  Right- 4.7 sec, Left- 2.7 sec  TODAY'S TREATMENT:        DATE: 05/23/22 Pt seen for aquatic therapy today.  Treatment took place in water 3.25-4.5 ft in depth at the [facility]. Temp of water was 91.  Pt entered/exited the pool via stairs independently with single rail.  -Walking forward/backward with reciprocal arm swing; side stepping with bilat shoulder add/abdct (no floats) - staggered stance with bilat  shoulder horiz abdct/ add - with fins donned on wrists:  staggered stance with bilat shoulder horiz abdct/add, add/abdct, ER/IR.  In standard stance: reciprocal arm swing - SLS with reciprocal arm swing with fins donned  - Holding low drain:  hip abdct x 10 each; hip ext to toe touch x 10 x 2 each; bilat heel raises x12 - TrA set with short blue noodle pull down, with focus on scap depression with slow eccentric return.  * bilat tricep push down with rainbow hand floats x 10; bilat shoulder addct with rainbow hand floats x 10 * wall push ups x 10 * forward step with single arm row ( with resistance bell) and return to neutral x 10 each LE  * SLS with arms crossed at chest * supported supine float for spinal decompression  Pt requires the buoyancy and hydrostatic pressure of water for support, and to offload joints by unweighting joint load by at least 50 % in navel deep water  and by at least 75-80% in chest to neck deep water.  Viscosity of the water is needed for resistance of strengthening. Water current perturbations provides challenge to standing balance requiring increased core activation.   DATE: 05/21/2022  UBE level 1.0 x3 min each direction with PT present to discuss status Cervical rotation 3x20 seconds , sidbending and flexion 2x20 seconds  Standing 3 way scapular stabilization with green loop x5 bilat Seated with ball against back:  performing extension x10 Standing single leg stance 2x10 sec each Counter push-up 2x10 Manual Therapy:  soft tissue mobilization to cervical paraspinals and upper traps, manual trigger point release to upper traps, suboccipital release   DATE: 05/16/2022  UBE level 1.0 x3 min each direction with PT present to discuss status Cervical rotation 3x20 seconds , sidbending and flexion 2x20 seconds  Supine seratus punches: 4# 2x10 Green loop around wrists: vectors Rt and Lt 5 each directions Manual Therapy:  soft tissue mobilization to cervical paraspinals  and upper traps, manual trigger point release to upper traps, suboccipital release and manual traction.  Passive stretch into sidebending Mechanical traction: 13/5# 60 seconds/10 seconds x12 minutes  DATE: 05/14/2022  Pt seen for aquatic therapy today.  Treatment took place in water 3.25-4.5 ft in depth at the [facility]. Temp of water was 91.  Pt entered/exited the pool via stairs independently with single rail.  * marching forward/backward with reciprocal arm swing; side stepping with bilat shoulder add/abdct (no floats) * standard stance with bilat shoulder horiz abdct/ addct; L's * return to walking forward with arm swing * TrA set with straight arm noodle pull down, with focus on scap depression with slow eccentric return.  * bilat tricep push down with rainbow hand floats x 10; bilat shoulder addct with rainbow hand floats x 10 * standard stance with kick board push / pull,cues for scap retraction and increased speed; single resistance bell with reciprocal arm swing, and small circles at side * forward step with single arm row ( with resistance bell) and return to neutral x 10 each LE   * Holding low drain:  hip abdct x 10 each; hip ext to toe touch x 10 each; bilat heel raises x12 * SLS R/L - 10s; requires skulling for each side to steady after 2 seconds.    Pt requires the buoyancy and hydrostatic pressure of water for support, and to offload joints by unweighting joint load by at least 50 % in navel deep water and by at least 75-80% in chest to neck deep water.  Viscosity of the water is needed for resistance of strengthening. Water current perturbations provides challenge to standing balance requiring increased core activation.                                                                                                                          PATIENT EDUCATION:  Education details: Issued HEP Person educated: Patient Education method: Explanation, Media planner, and  Handouts Education comprehension:  verbalized understanding and returned demonstration  HOME EXERCISE PROGRAM: Access Code: [email or web address] URL: [email or web address] Date: 05/01/2022 Prepared by: Shelby Dubin Menke  Exercises - Seated Cervical Retraction  - 3 x daily - 7 x weekly - 1 sets - 10 reps - 3-5 sec hold - Seated Cervical Extension AROM  - 1 x daily - 7 x weekly - 2 sets - 10 reps - Seated Cervical Sidebending AROM  - 3 x daily - 7 x weekly - 1 sets - 3 reps - 20 hold - Seated Cervical Rotation AROM  - 3 x daily - 7 x weekly - 1 sets - 3 reps - 20 hold - Seated Correct Posture  - 1 x daily - 7 x weekly - 3 sets - 10 reps - Standing Row with Anchored Resistance  - 1 x daily - 7 x weekly - 3 sets - 10 reps - Single Arm Shoulder Extension with Anchored Resistance  - 1 x daily - 7 x weekly - 3 sets - 10 reps - Wall Push Up  - 1 x daily - 7 x weekly - 2 sets - 10 reps  ASSESSMENT:  CLINICAL IMPRESSION: Pt tolerated trial of fins on UE with exercises well. Encouraged him to monitor his speed of UE as to not overdo it.  He reported no increase in pain. Demonstrated improved balance in staggered and SLS today.  Encouraged pt to check out facility with pool, if interested in continuing aquatics independently. Pt to do this prior to next aquatic visit and will decide if creating aquatic HEP.  Pt making progress towards established goals.    OBJECTIVE IMPAIRMENTS: Abnormal gait, decreased balance, decreased strength, increased muscle spasms, impaired flexibility, postural dysfunction, and pain.   ACTIVITY LIMITATIONS: carrying, lifting, and bending  PARTICIPATION LIMITATIONS: community activity  PERSONAL FACTORS: Past/current experiences, Time since onset of injury/illness/exacerbation, and 1-2 comorbidities: OA, Diabetes  are also affecting patient's functional outcome.   REHAB POTENTIAL: Good  CLINICAL DECISION MAKING: Evolving/moderate complexity  EVALUATION COMPLEXITY:  Moderate   GOALS: Goals reviewed with patient? Yes  SHORT TERM GOALS: Target date: 05/17/2022  Pt will be independent with initial HEP. Baseline:  Goal status: MET  2.  Patient will report at least 30% improvement in symptoms/decreased pain since starting therapy. Baseline: 4/10 Goal status: MET   LONG TERM GOALS: Target date: 06/21/2022  Pt will be independent with advanced HEP and knowledge of independent progression. Baseline:  Goal status: IN PROGRESS  2.  Patient will increase FOTO to at least 57% to demonstrate improvements in functional mobility. Baseline: 53% Goal status: INITIAL  3.  Patient will increase cervical A/ROM to [facility] to allow him to drive without any increased strain to neck. Baseline: see chart Goal status: IN PROGRESS  4.  Patient will increase left shoulder and hip strength to at least 5-/5 throughout to allow him to lift objects without difficulty. Baseline: 4 to 4+/5 Goal status: INITIAL  5.  Patient will increase bilateral single leg stance to at least 20 seconds to decrease risk of falling. Baseline: less than 5 sec on each side Goal status: IN PROGRESS   PLAN:  PT FREQUENCY: 2x/week  PT DURATION: 8 weeks  PLANNED INTERVENTIONS: Therapeutic exercises, Therapeutic activity, Neuromuscular re-education, Balance training, Gait training, Patient/Family education, Self Care, Joint mobilization, Joint manipulation, Aquatic Therapy, Dry Needling, Electrical stimulation, Spinal manipulation, Spinal mobilization, Cryotherapy, Moist heat, Taping, Traction, Ultrasound, Ionotophoresis '4mg'$ /ml Dexamethasone, Manual therapy, and Re-evaluation  PLAN FOR NEXT SESSION: assess and progress HEP as  indicated, strengthening, flexibility, aquatic PT, balance, next land session, try Nustep instead of UBE   Kerin Perna, PTA 05/23/22 5:47 PM [facility] [place], [place], [address] Phone:  [phone]   Fax:  [phone]

## 2022-05-27 ENCOUNTER — Telehealth: Payer: Self-pay | Admitting: Internal Medicine

## 2022-05-27 NOTE — Telephone Encounter (Signed)
Called pt advised of MD recommendations. Pt is agreeable no questions or concerns voiced.

## 2022-05-27 NOTE — Telephone Encounter (Signed)
Called pt in regards to HCTZ.  Pt stopped taking med abut 4 days ago d/t side effects.  Reports has glaucoma and this med can cause disease to worsen.  Also notes is anemic and PLT count has elevated since starting med. Does not want to take this med because it will make other conditions worse as well as cause new conditions.  Does not check BP.  Advised to check BP daily to help determine med alternative.  Is willing to try another medication.  Will send to MD to advise.

## 2022-05-27 NOTE — Telephone Encounter (Signed)
Pt c/o medication issue:  1. Name of Medication:   hydrochlorothiazide (MICROZIDE) 12.5 MG capsule    2. How are you currently taking this medication (dosage and times per day)?   3. Are you having a reaction (difficulty breathing--STAT)?   4. What is your medication issue? Patient states he is returning call in regards to this medication in his last MyChart message. He states he would like to discontinue this medication and would like to be advised on what medication he can switch to.

## 2022-05-28 ENCOUNTER — Ambulatory Visit (HOSPITAL_BASED_OUTPATIENT_CLINIC_OR_DEPARTMENT_OTHER): Payer: Medicare HMO | Admitting: Physical Therapy

## 2022-05-28 DIAGNOSIS — M542 Cervicalgia: Secondary | ICD-10-CM

## 2022-05-28 DIAGNOSIS — M6281 Muscle weakness (generalized): Secondary | ICD-10-CM

## 2022-05-28 DIAGNOSIS — R252 Cramp and spasm: Secondary | ICD-10-CM

## 2022-05-28 NOTE — Therapy (Signed)
OUTPATIENT PHYSICAL THERAPY TREATMENT NOTE   Patient Name: Mark Travis MRN: [id number] DOB:Nov 09, 1950, 72 y.o., male Today's Date: 05/28/2022  END OF SESSION:  PT End of Session - 05/28/22 1457     Visit Number 9    Date for PT Re-Evaluation 06/21/22    Authorization Type [employer]    Authorization Time Period 05/01/2022 - 06/21/2022    Authorization - Visit Number 9    Authorization - Number of Visits 16    Progress Note Due on Visit 10    PT Start Time [id number]    PT Stop Time 1525    PT Time Calculation (min) 40 min    Activity Tolerance Patient tolerated treatment well    Behavior During Therapy WFL for tasks assessed/performed                Past Medical History:  Diagnosis Date   Chronic low back pain    Decreased hearing    Diverticulosis    Emphysema (subcutaneous) (surgical) resulting from a procedure    Family history of cancer    Fatty liver    Glaucoma    Hiatal hernia    IBS (irritable bowel syndrome)    OSA on CPAP    Prostatitis    Seasonal allergies    Past Surgical History:  Procedure Laterality Date   COLONOSCOPY  10/2012   L trigger finger release     Patient Active Problem List   Diagnosis Date Noted   Genetic testing 05/03/2022   Family history of cancer 03/26/2022   Myalgia due to statin 03/15/2022   Coronary artery calcification 03/15/2022   Aortic atherosclerosis ([place]) 03/15/2022   Chest pain of uncertain etiology [id number]   Right carotid bruit 08/28/2021   Claudication of both lower extremities ([place]) 08/28/2021   Tobacco abuse 08/28/2021   Hypertension associated with diabetes ([place]) 08/28/2021   Allergic rhinitis due to animal (cat) (dog) hair and dander 08/07/2021   Allergic rhinitis due to pollen 08/07/2021   Chronic allergic conjunctivitis 08/07/2021   Chronic rhinitis 08/07/2021   Multiple thyroid nodules 05/09/2021   Nocturia 05/23/2020   Right knee pain 04/27/2020   Cervical radiculopathy 12/24/2019   Pulmonary  nodule less than 6 cm determined by computed tomography of lung 11/25/2019   Hyperlipidemia 11/09/2019   Ptosis of right eyelid 05/27/2019   Deviated septum 03/31/2019   Type 2 diabetes mellitus ([place]) 03/23/2019   Chronic low back pain 03/19/2019   Fatty liver 03/19/2019   Other irritable bowel syndrome 03/19/2019   Other specified glaucoma 03/19/2019   Seasonal allergies 03/19/2019   Pulmonary emphysema ([place]) 12/09/2018   Loud snoring 12/09/2018   OSA and COPD overlap syndrome ([place]) 12/09/2018   Elevated prostate specific antigen (PSA) 05/27/2018   Patellofemoral syndrome of left knee 04/16/2017   Patellofemoral syndrome of right knee 04/16/2017   Primary open angle glaucoma of both eyes, moderate stage 11/11/2015   Cataract, nuclear, bilateral 09/18/2013   Degenerative progressive high myopia, bilateral 09/18/2013    PCP: Heywood Bene, PA-C  REFERRING PROVIDER: Kristeen Miss, MD  REFERRING DIAG: [phone] (ICD-10-CM) - Other spondylosis, cervical region  THERAPY DIAG:  No diagnosis found.  Rationale for Evaluation and Treatment: Rehabilitation  ONSET DATE: chronic, but worse the past year  SUBJECTIVE:  SUBJECTIVE STATEMENT: Pt reports he feels like he is getting stronger.   He renewed silver sneakers and plans to start going to [facility]).    PERTINENT HISTORY:  Diabetes, Hypertension, Glaucoma, OA  PAIN:  Are you having pain?Yes : NPRS scale: 1/10 Pain location: post neck   Pain description: "just enough to know it's there".  Aggravating factors: sleeping, overhead activities Relieving factors: traction, thermal modalities  PRECAUTIONS: None  WEIGHT BEARING RESTRICTIONS: No  FALLS:  Has patient fallen in last 6 months? Yes. Number of falls 1 due to a  vasovagal reaction when waking up and getting up to go to the bathroom  LIVING ENVIRONMENT: Lives with: lives with their spouse Lives in: House/apartment Stairs: Yes: Internal: 15 steps; on right going up Has following equipment at home:  has purchased a shower grab bar, but has not installed yet  OCCUPATION: part-time tax Press photographer, works primarily during tax season  PLOF: Independent and Leisure: used to enjoy boating, photography, reading, gaming on computer  PATIENT GOALS: To have more consistency on decreased pain level.  NEXT MD VISIT: July 2024  OBJECTIVE:   DIAGNOSTIC FINDINGS:  Older MRI and Cervical Radiographs in 2022, will likely need updated pending visit in July 2024.  PATIENT SURVEYS:  Eval:  FOTO 53% (projected 57% by visit 11)  COGNITION: Overall cognitive status: Within functional limits for tasks assessed  SENSATION: Eval:  reports tingling down left arm and occasionally left leg  POSTURE: rounded shoulders and forward head  PALPATION: Eval:  Mild tightness and spasms with cervical paraspinals and upper traps   CERVICAL ROM:   Active ROM A/PROM (deg) eval A/ROM 05/16/22  Flexion 50   Extension 30   Right lateral flexion 20 28  Left lateral flexion 15 20  Right rotation 40 50  Left rotation 45 50   (Blank rows = not tested)  UPPER EXTREMITY ROM:  WFL  UPPER EXTREMITY MMT: Eval:   Right UE is 5/5, Left shoulder is 4+ to 5-/5 Right hip strength WFL, Left hip is 4 to 4+/5  FUNCTIONAL TESTS:  Eval: 5 times sit to stand: 14.9 sec Single Leg Stance:  Right- 4.7 sec, Left- 2.7 sec  TODAY'S TREATMENT:        DATE: 05/28/22 Pt seen for aquatic therapy today.  Treatment took place in water 3.25-4.5 ft in depth at the [facility]. Temp of water was 91.  Pt entered/exited the pool via stairs independently with single rail.  -Walking forward/backward with reciprocal arm swing; side stepping with bilat shoulder add/abdct (no  floats) - wall push ups/off x 10; x 5  - staggered stance with bilat shoulder horiz abdct/ add - holding rainbow hand floats - UE support with rainbow hand floats:  LE hip abdct/add x 10; LE swings into hip flex/ext x 10 each - side step with arm abdct/add with rainbow hand floats; repeated with added side squat  - TrA set with short blue noodle pull down, with focus on scap depression with slow eccentric return.  - SLS without use of arms x 20s each - with fins donned on wrists:  staggered stance with bilat shoulder ER/IR; bilat shoulder flex/ext; bilat horiz abdct/ add.  In standard stance: reciprocal arm swing - high knee marching with reciprocal arm swing with fins donned  - staggered stance with kick board push pull Pt requires the buoyancy and hydrostatic pressure of water for support, and to offload joints by unweighting joint load by at least 50 %  in navel deep water and by at least 75-80% in chest to neck deep water.  Viscosity of the water is needed for resistance of strengthening. Water current perturbations provides challenge to standing balance requiring increased core activation.   DATE: 05/21/2022  UBE level 1.0 x3 min each direction with PT present to discuss status Cervical rotation 3x20 seconds , sidbending and flexion 2x20 seconds  Standing 3 way scapular stabilization with green loop x5 bilat Seated with ball against back:  performing extension x10 Standing single leg stance 2x10 sec each Counter push-up 2x10 Manual Therapy:  soft tissue mobilization to cervical paraspinals and upper traps, manual trigger point release to upper traps, suboccipital release   DATE: 05/16/2022  UBE level 1.0 x3 min each direction with PT present to discuss status Cervical rotation 3x20 seconds , sidbending and flexion 2x20 seconds  Supine seratus punches: 4# 2x10 Green loop around wrists: vectors Rt and Lt 5 each directions Manual Therapy:  soft tissue mobilization to cervical paraspinals and  upper traps, manual trigger point release to upper traps, suboccipital release and manual traction.  Passive stretch into sidebending Mechanical traction: 13/5# 60 seconds/10 seconds x12 minutes                                                                                                                          PATIENT EDUCATION:  Education details: Issued HEP Person educated: Patient Education method: Explanation, Demonstration, and Handouts Education comprehension: verbalized understanding and returned demonstration  HOME EXERCISE PROGRAM: Access Code: [email or web address] URL: [email or web address] Date: 05/01/2022 Prepared by: Shelby Dubin Menke  Exercises - Seated Cervical Retraction  - 3 x daily - 7 x weekly - 1 sets - 10 reps - 3-5 sec hold - Seated Cervical Extension AROM  - 1 x daily - 7 x weekly - 2 sets - 10 reps - Seated Cervical Sidebending AROM  - 3 x daily - 7 x weekly - 1 sets - 3 reps - 20 hold - Seated Cervical Rotation AROM  - 3 x daily - 7 x weekly - 1 sets - 3 reps - 20 hold - Seated Correct Posture  - 1 x daily - 7 x weekly - 3 sets - 10 reps - Standing Row with Anchored Resistance  - 1 x daily - 7 x weekly - 3 sets - 10 reps - Single Arm Shoulder Extension with Anchored Resistance  - 1 x daily - 7 x weekly - 3 sets - 10 reps - Wall Push Up  - 1 x daily - 7 x weekly - 2 sets - 10 reps  ASSESSMENT:  CLINICAL IMPRESSION: Pt tolerated use of rainbow hand floats with UE. He reported occasional increase in neck tightness but no increase in pain. Demonstrated improved balance in staggered and SLS today. Will plan to make aquatic HEP for him to continue using independently at [facility]; will issue at next pool appt and if comfortable will d/c from aquatic therapy that date and continue with land  appts.  Pt making progress towards established goals. PT to complete 10th visit progress note at next visit.    OBJECTIVE IMPAIRMENTS: Abnormal gait, decreased balance, decreased  strength, increased muscle spasms, impaired flexibility, postural dysfunction, and pain.   ACTIVITY LIMITATIONS: carrying, lifting, and bending  PARTICIPATION LIMITATIONS: community activity  PERSONAL FACTORS: Past/current experiences, Time since onset of injury/illness/exacerbation, and 1-2 comorbidities: OA, Diabetes  are also affecting patient's functional outcome.   REHAB POTENTIAL: Good  CLINICAL DECISION MAKING: Evolving/moderate complexity  EVALUATION COMPLEXITY: Moderate   GOALS: Goals reviewed with patient? Yes  SHORT TERM GOALS: Target date: 05/17/2022  Pt will be independent with initial HEP. Baseline:  Goal status: MET  2.  Patient will report at least 30% improvement in symptoms/decreased pain since starting therapy. Baseline: 4/10 Goal status: MET   LONG TERM GOALS: Target date: 06/21/2022  Pt will be independent with advanced HEP and knowledge of independent progression. Baseline:  Goal status: IN PROGRESS  2.  Patient will increase FOTO to at least 57% to demonstrate improvements in functional mobility. Baseline: 53% Goal status: INITIAL  3.  Patient will increase cervical A/ROM to [facility] to allow him to drive without any increased strain to neck. Baseline: see chart Goal status: IN PROGRESS  4.  Patient will increase left shoulder and hip strength to at least 5-/5 throughout to allow him to lift objects without difficulty. Baseline: 4 to 4+/5 Goal status: INITIAL  5.  Patient will increase bilateral single leg stance to at least 20 seconds to decrease risk of falling. Baseline: less than 5 sec on each side Goal status: IN PROGRESS   PLAN:  PT FREQUENCY: 2x/week  PT DURATION: 8 weeks  PLANNED INTERVENTIONS: Therapeutic exercises, Therapeutic activity, Neuromuscular re-education, Balance training, Gait training, Patient/Family education, Self Care, Joint mobilization, Joint manipulation, Aquatic Therapy, Dry Needling, Electrical stimulation, Spinal  manipulation, Spinal mobilization, Cryotherapy, Moist heat, Taping, Traction, Ultrasound, Ionotophoresis 58m/ml Dexamethasone, Manual therapy, and Re-evaluation  PLAN FOR NEXT SESSION: assess and progress HEP as indicated, strengthening, flexibility, aquatic PT, balance, next land session, try Nustep instead of UBE  JKerin Perna PTA 05/28/22 3:25 PM [place] [address]Phone: [phone]  Fax:  [phone]

## 2022-05-31 ENCOUNTER — Ambulatory Visit: Payer: Medicare HMO | Admitting: Rehabilitative and Restorative Service Providers"

## 2022-05-31 ENCOUNTER — Encounter: Payer: Self-pay | Admitting: Rehabilitative and Restorative Service Providers"

## 2022-05-31 DIAGNOSIS — R2689 Other abnormalities of gait and mobility: Secondary | ICD-10-CM

## 2022-05-31 DIAGNOSIS — M542 Cervicalgia: Secondary | ICD-10-CM | POA: Diagnosis not present

## 2022-05-31 DIAGNOSIS — M6281 Muscle weakness (generalized): Secondary | ICD-10-CM

## 2022-05-31 DIAGNOSIS — R252 Cramp and spasm: Secondary | ICD-10-CM

## 2022-05-31 DIAGNOSIS — M79602 Pain in left arm: Secondary | ICD-10-CM

## 2022-05-31 NOTE — Therapy (Signed)
OUTPATIENT PHYSICAL THERAPY TREATMENT NOTE   Patient Name: Mark Travis MRN: [id number] DOB:12/30/1950, 72 y.o., male Today's Date: 05/31/2022   Progress Note Reporting Period 05/01/2022 to 05/31/2022  See note below for Objective Data and Assessment of Progress/Goals.       END OF SESSION:  PT End of Session - 05/31/22 0929     Visit Number 10    Date for PT Re-Evaluation 06/21/22    Authorization Type [employer]    Authorization Time Period 05/01/2022 - 06/21/2022    Authorization - Visit Number 10    Authorization - Number of Visits 10    Progress Note Due on Visit 10    PT Start Time 0926    PT Stop Time 1005    PT Time Calculation (min) 39 min    Activity Tolerance Patient tolerated treatment well    Behavior During Therapy WFL for tasks assessed/performed                Past Medical History:  Diagnosis Date   Chronic low back pain    Decreased hearing    Diverticulosis    Emphysema (subcutaneous) (surgical) resulting from a procedure    Family history of cancer    Fatty liver    Glaucoma    Hiatal hernia    IBS (irritable bowel syndrome)    OSA on CPAP    Prostatitis    Seasonal allergies    Past Surgical History:  Procedure Laterality Date   COLONOSCOPY  10/2012   L trigger finger release     Patient Active Problem List   Diagnosis Date Noted   Genetic testing 05/03/2022   Family history of cancer 03/26/2022   Myalgia due to statin 03/15/2022   Coronary artery calcification 03/15/2022   Aortic atherosclerosis ([place]) 03/15/2022   Chest pain of uncertain etiology 99991111   Right carotid bruit 08/28/2021   Claudication of both lower extremities ([place]) 08/28/2021   Tobacco abuse 08/28/2021   Hypertension associated with diabetes ([place]) 08/28/2021   Allergic rhinitis due to animal (cat) (dog) hair and dander 08/07/2021   Allergic rhinitis due to pollen 08/07/2021   Chronic allergic conjunctivitis 08/07/2021   Chronic rhinitis 08/07/2021    Multiple thyroid nodules 05/09/2021   Nocturia 05/23/2020   Right knee pain 04/27/2020   Cervical radiculopathy 12/24/2019   Pulmonary nodule less than 6 cm determined by computed tomography of lung 11/25/2019   Hyperlipidemia 11/09/2019   Ptosis of right eyelid 05/27/2019   Deviated septum 03/31/2019   Type 2 diabetes mellitus ([place]) 03/23/2019   Chronic low back pain 03/19/2019   Fatty liver 03/19/2019   Other irritable bowel syndrome 03/19/2019   Other specified glaucoma 03/19/2019   Seasonal allergies 03/19/2019   Pulmonary emphysema ([place]) 12/09/2018   Loud snoring 12/09/2018   OSA and COPD overlap syndrome ([place]) 12/09/2018   Elevated prostate specific antigen (PSA) 05/27/2018   Patellofemoral syndrome of left knee 04/16/2017   Patellofemoral syndrome of right knee 04/16/2017   Primary open angle glaucoma of both eyes, moderate stage 11/11/2015   Cataract, nuclear, bilateral 09/18/2013   Degenerative progressive high myopia, bilateral 09/18/2013    PCP: Heywood Bene, PA-C  REFERRING PROVIDER: Kristeen Miss, MD  REFERRING DIAG: [phone] (ICD-10-CM) - Other spondylosis, cervical region  THERAPY DIAG:  Cervicalgia  Cramp and spasm  Muscle weakness (generalized)  Pain in left arm  Other abnormalities of gait and mobility  Rationale for Evaluation and Treatment: Rehabilitation  ONSET DATE: chronic, but  worse the past year  SUBJECTIVE:                                                                                                                                                                                                         SUBJECTIVE STATEMENT: Pt reports he is having some knee pain today and some tightness in his cervical region.  Pt reports that he is feeling at least 80% better since starting PT.   PERTINENT HISTORY:  Diabetes, Hypertension, Glaucoma, OA  PAIN:  Are you having pain?Yes : NPRS scale: 1/10 Pain location: post neck and knee Pain  description: "just enough to know it's there".  Aggravating factors: sleeping, overhead activities Relieving factors: traction, thermal modalities  PRECAUTIONS: None  WEIGHT BEARING RESTRICTIONS: No  FALLS:  Has patient fallen in last 6 months? Yes. Number of falls 1 due to a vasovagal reaction when waking up and getting up to go to the bathroom  LIVING ENVIRONMENT: Lives with: lives with their spouse Lives in: House/apartment Stairs: Yes: Internal: 15 steps; on right going up Has following equipment at home:  has purchased a shower grab bar, but has not installed yet  OCCUPATION: part-time tax Press photographer, works primarily during tax season  PLOF: Independent and Leisure: used to enjoy boating, photography, reading, gaming on computer  PATIENT GOALS: To have more consistency on decreased pain level.  NEXT MD VISIT: July 2024  OBJECTIVE:   DIAGNOSTIC FINDINGS:  Older MRI and Cervical Radiographs in 2022, will likely need updated pending visit in July 2024.  PATIENT SURVEYS:  Eval:  FOTO 53% (projected 57% by visit 11) 05/31/2022:  FOTO 66%  COGNITION: Overall cognitive status: Within functional limits for tasks assessed  SENSATION: Eval:  reports tingling down left arm and occasionally left leg  POSTURE: rounded shoulders and forward head  PALPATION: Eval:  Mild tightness and spasms with cervical paraspinals and upper traps   CERVICAL ROM:   Active ROM A/ROM (deg) eval A/ROM 05/16/22 A/ROM (Deg) 05/31/22  Flexion 50  50  Extension 30  42  Right lateral flexion 20 28 30  $ Left lateral flexion 15 20 25  $ Right rotation 40 50 55  Left rotation 45 50 55   (Blank rows = not tested)  UPPER EXTREMITY ROM:  WFL  UPPER EXTREMITY MMT: Eval:   Right UE is 5/5, Left shoulder is 4+ to 5-/5 Right hip strength WFL, Left hip is 4 to 4+/5  FUNCTIONAL TESTS:  Eval: 5 times sit to stand: 14.9 sec Single Leg Stance:  Right- 4.7 sec, Left- 2.7  sec  05/31/2022: 5 times sit  to stand: 12.5 sec Single Leg Stance:  Right-  22.8 sec, Left-  28.2 sec  TODAY'S TREATMENT:         DATE: 05/31/2022 Nustep level 4 x6 min with PT present to discuss status Single leg stance, 5 times sit to stand, FOTO Sit to/from stand holding 5# kettlebell:  x10 with chest press, x10 with overhead press Seated core series with 5# kettlebell:  hip to hip, hip to opp shoulder, reverse seated crunch.  X10 each bilat Standing 3 way scapular stabilization with green loop x5 bilat Wall push ups 2x10 Standing lat pull-down 40# 2x10 Wall squats 2x5 Standing hamstring stretch at stairs 2x20 sec Standing calf stretch with heels hanging off step x20 sec   DATE: 05/28/22 Pt seen for aquatic therapy today.  Treatment took place in water 3.25-4.5 ft in depth at the [facility]. Temp of water was 91.  Pt entered/exited the pool via stairs independently with single rail.  -Walking forward/backward with reciprocal arm swing; side stepping with bilat shoulder add/abdct (no floats) - wall push ups/off x 10; x 5  - staggered stance with bilat shoulder horiz abdct/ add - holding rainbow hand floats - UE support with rainbow hand floats:  LE hip abdct/add x 10; LE swings into hip flex/ext x 10 each - side step with arm abdct/add with rainbow hand floats; repeated with added side squat  - TrA set with short blue noodle pull down, with focus on scap depression with slow eccentric return.  - SLS without use of arms x 20s each - with fins donned on wrists:  staggered stance with bilat shoulder ER/IR; bilat shoulder flex/ext; bilat horiz abdct/ add.  In standard stance: reciprocal arm swing - high knee marching with reciprocal arm swing with fins donned  - staggered stance with kick board push pull Pt requires the buoyancy and hydrostatic pressure of water for support, and to offload joints by unweighting joint load by at least 50 % in navel deep water and by at least 75-80% in chest to neck deep  water.  Viscosity of the water is needed for resistance of strengthening. Water current perturbations provides challenge to standing balance requiring increased core activation.   DATE: 05/21/2022  UBE level 1.0 x3 min each direction with PT present to discuss status Cervical rotation 3x20 seconds , sidbending and flexion 2x20 seconds  Standing 3 way scapular stabilization with green loop x5 bilat Seated with ball against back:  performing extension x10 Standing single leg stance 2x10 sec each Counter push-up 2x10 Manual Therapy:  soft tissue mobilization to cervical paraspinals and upper traps, manual trigger point release to upper traps, suboccipital release                                                                                                                         PATIENT EDUCATION:  Education details: Issued HEP Person educated: Patient Education method: Explanation, Demonstration, and Handouts  Education comprehension: verbalized understanding and returned demonstration  HOME EXERCISE PROGRAM: Access Code: [email or web address] URL: [email or web address] Date: 05/01/2022 Prepared by: Shelby Dubin Chisa Kushner  Exercises - Seated Cervical Retraction  - 3 x daily - 7 x weekly - 1 sets - 10 reps - 3-5 sec hold - Seated Cervical Extension AROM  - 1 x daily - 7 x weekly - 2 sets - 10 reps - Seated Cervical Sidebending AROM  - 3 x daily - 7 x weekly - 1 sets - 3 reps - 20 hold - Seated Cervical Rotation AROM  - 3 x daily - 7 x weekly - 1 sets - 3 reps - 20 hold - Seated Correct Posture  - 1 x daily - 7 x weekly - 3 sets - 10 reps - Standing Row with Anchored Resistance  - 1 x daily - 7 x weekly - 3 sets - 10 reps - Single Arm Shoulder Extension with Anchored Resistance  - 1 x daily - 7 x weekly - 3 sets - 10 reps - Wall Push Up  - 1 x daily - 7 x weekly - 2 sets - 10 reps  ASSESSMENT:  CLINICAL IMPRESSION: Ms Reineck presents to skilled PT reporting that he is overall making great  progress.  Patient has improved on his 5 times sit to stand and single leg stance and pt has met FOTO score.  Patient is progressing with increased balance, but still unable to achieve 30 seconds on single leg stance.  Patient with some difficulty with increased strengthening and core exercises.  Patient is progressing well towards goal related activities.   OBJECTIVE IMPAIRMENTS: Abnormal gait, decreased balance, decreased strength, increased muscle spasms, impaired flexibility, postural dysfunction, and pain.   ACTIVITY LIMITATIONS: carrying, lifting, and bending  PARTICIPATION LIMITATIONS: community activity  PERSONAL FACTORS: Past/current experiences, Time since onset of injury/illness/exacerbation, and 1-2 comorbidities: OA, Diabetes  are also affecting patient's functional outcome.   REHAB POTENTIAL: Good  CLINICAL DECISION MAKING: Evolving/moderate complexity  EVALUATION COMPLEXITY: Moderate   GOALS: Goals reviewed with patient? Yes  SHORT TERM GOALS: Target date: 05/17/2022  Pt will be independent with initial HEP. Baseline:  Goal status: MET  2.  Patient will report at least 30% improvement in symptoms/decreased pain since starting therapy. Baseline: 4/10 Goal status: MET   LONG TERM GOALS: Target date: 06/21/2022  Pt will be independent with advanced HEP and knowledge of independent progression. Baseline:  Goal status: IN PROGRESS  2.  Patient will increase FOTO to at least 57% to demonstrate improvements in functional mobility. Baseline: 53% Goal status: MET on 05/31/2022  3.  Patient will increase cervical A/ROM to [facility] to allow him to drive without any increased strain to neck. Baseline: see chart Goal status: IN PROGRESS  4.  Patient will increase left shoulder and hip strength to at least 5-/5 throughout to allow him to lift objects without difficulty. Baseline: 4 to 4+/5 Goal status: INITIAL  5.  Patient will increase bilateral single leg stance to at least  30 seconds to decrease risk of falling. Baseline: less than 5 sec on each side Goal status: IN PROGRESS (updated time on 05/31/2022)   PLAN:  PT FREQUENCY: 2x/week  PT DURATION: 8 weeks  PLANNED INTERVENTIONS: Therapeutic exercises, Therapeutic activity, Neuromuscular re-education, Balance training, Gait training, Patient/Family education, Self Care, Joint mobilization, Joint manipulation, Aquatic Therapy, Dry Needling, Electrical stimulation, Spinal manipulation, Spinal mobilization, Cryotherapy, Moist heat, Taping, Traction, Ultrasound, Ionotophoresis 96m/ml Dexamethasone, Manual therapy, and Re-evaluation  PLAN FOR NEXT SESSION:  assess and progress HEP as indicated, strengthening, flexibility, aquatic PT, balance    Juel Burrow, PT 05/31/22 10:15 AM  [facility] [address] [place], [place] [address] Phone # [phone] Fax [phone]

## 2022-06-04 ENCOUNTER — Encounter (HOSPITAL_BASED_OUTPATIENT_CLINIC_OR_DEPARTMENT_OTHER): Payer: Self-pay | Admitting: Physical Therapy

## 2022-06-04 ENCOUNTER — Ambulatory Visit (HOSPITAL_BASED_OUTPATIENT_CLINIC_OR_DEPARTMENT_OTHER): Payer: Medicare HMO | Admitting: Physical Therapy

## 2022-06-04 DIAGNOSIS — M6281 Muscle weakness (generalized): Secondary | ICD-10-CM

## 2022-06-04 DIAGNOSIS — R252 Cramp and spasm: Secondary | ICD-10-CM

## 2022-06-04 DIAGNOSIS — M542 Cervicalgia: Secondary | ICD-10-CM | POA: Diagnosis not present

## 2022-06-04 NOTE — Therapy (Signed)
OUTPATIENT PHYSICAL THERAPY TREATMENT NOTE   Patient Name: Mark Travis MRN: [id number] DOB:[birth year], 72 y.o., male Today's Date: 06/04/2022   Progress Note Reporting Period 05/01/2022 to 05/31/2022  See note below for Objective Data and Assessment of Progress/Goals.       END OF SESSION:  PT End of Session - 06/04/22 1451     Visit Number 11    Date for PT Re-Evaluation 06/21/22    Authorization Type [employer]    Authorization Time Period 05/01/2022 - 06/21/2022    Progress Note Due on Visit 20    PT Start Time [id number]    PT Stop Time 1525    PT Time Calculation (min) 40 min    Activity Tolerance Patient tolerated treatment well    Behavior During Therapy WFL for tasks assessed/performed                Past Medical History:  Diagnosis Date   Chronic low back pain    Decreased hearing    Diverticulosis    Emphysema (subcutaneous) (surgical) resulting from a procedure    Family history of cancer    Fatty liver    Glaucoma    Hiatal hernia    IBS (irritable bowel syndrome)    OSA on CPAP    Prostatitis    Seasonal allergies    Past Surgical History:  Procedure Laterality Date   COLONOSCOPY  10/2012   L trigger finger release     Patient Active Problem List   Diagnosis Date Noted   Genetic testing 05/03/2022   Family history of cancer 03/26/2022   Myalgia due to statin 03/15/2022   Coronary artery calcification 03/15/2022   Aortic atherosclerosis ([place]) 03/15/2022   Chest pain of uncertain etiology [id number]   Right carotid bruit 08/28/2021   Claudication of both lower extremities ([place]) 08/28/2021   Tobacco abuse 08/28/2021   Hypertension associated with diabetes ([place]) 08/28/2021   Allergic rhinitis due to animal (cat) (dog) hair and dander 08/07/2021   Allergic rhinitis due to pollen 08/07/2021   Chronic allergic conjunctivitis 08/07/2021   Chronic rhinitis 08/07/2021   Multiple thyroid nodules 05/09/2021   Nocturia 05/23/2020   Right knee  pain 04/27/2020   Cervical radiculopathy 12/24/2019   Pulmonary nodule less than 6 cm determined by computed tomography of lung 11/25/2019   Hyperlipidemia 11/09/2019   Ptosis of right eyelid 05/27/2019   Deviated septum 03/31/2019   Type 2 diabetes mellitus ([place]) 03/23/2019   Chronic low back pain 03/19/2019   Fatty liver 03/19/2019   Other irritable bowel syndrome 03/19/2019   Other specified glaucoma 03/19/2019   Seasonal allergies 03/19/2019   Pulmonary emphysema ([place]) 12/09/2018   Loud snoring 12/09/2018   OSA and COPD overlap syndrome ([place]) 12/09/2018   Elevated prostate specific antigen (PSA) 05/27/2018   Patellofemoral syndrome of left knee 04/16/2017   Patellofemoral syndrome of right knee 04/16/2017   Primary open angle glaucoma of both eyes, moderate stage 11/11/2015   Cataract, nuclear, bilateral 09/18/2013   Degenerative progressive high myopia, bilateral 09/18/2013    PCP: Heywood Bene, PA-C  REFERRING PROVIDER: Kristeen Miss, MD  REFERRING DIAG: [id number] (ICD-10-CM) - Other spondylosis, cervical region  THERAPY DIAG:  Cervicalgia  Cramp and spasm  Muscle weakness (generalized)  Rationale for Evaluation and Treatment: Rehabilitation  ONSET DATE: chronic, but worse the past year  SUBJECTIVE:  SUBJECTIVE STATEMENT: Pt reports he is continues to have knee pain.  He is wearing a sleeve on Lt knee.  Pt verbalizes readiness to d/c from aquatic therapy after today's visit.  His work schedule has been hectic.   PERTINENT HISTORY:  Diabetes, Hypertension, Glaucoma, OA  PAIN:  Are you having pain?Yes : NPRS scale: 2-3/10 Pain location: Lt knee Pain description: "just enough to know it's there".  Aggravating factors: sleeping, overhead activities Relieving  factors: traction, thermal modalities  PRECAUTIONS: None  WEIGHT BEARING RESTRICTIONS: No  FALLS:  Has patient fallen in last 6 months? Yes. Number of falls 1 due to a vasovagal reaction when waking up and getting up to go to the bathroom  LIVING ENVIRONMENT: Lives with: lives with their spouse Lives in: House/apartment Stairs: Yes: Internal: 15 steps; on right going up Has following equipment at home:  has purchased a shower grab bar, but has not installed yet  OCCUPATION: part-time tax Press photographer, works primarily during tax season  PLOF: Independent and Leisure: used to enjoy boating, photography, reading, gaming on computer  PATIENT GOALS: To have more consistency on decreased pain level.  NEXT MD VISIT: July 2024  OBJECTIVE:   DIAGNOSTIC FINDINGS:  Older MRI and Cervical Radiographs in 2022, will likely need updated pending visit in July 2024.  PATIENT SURVEYS:  Eval:  FOTO 53% (projected 57% by visit 11) 05/31/2022:  FOTO 66%  COGNITION: Overall cognitive status: Within functional limits for tasks assessed  SENSATION: Eval:  reports tingling down left arm and occasionally left leg  POSTURE: rounded shoulders and forward head  PALPATION: Eval:  Mild tightness and spasms with cervical paraspinals and upper traps   CERVICAL ROM:   Active ROM A/ROM (deg) eval A/ROM 05/16/22 A/ROM (Deg) 05/31/22  Flexion 50  50  Extension 30  42  Right lateral flexion 20 28 30  $ Left lateral flexion 15 20 25  $ Right rotation 40 50 55  Left rotation 45 50 55   (Blank rows = not tested)  UPPER EXTREMITY ROM:  WFL  UPPER EXTREMITY MMT: Eval:   Right UE is 5/5, Left shoulder is 4+ to 5-/5 Right hip strength WFL, Left hip is 4 to 4+/5  FUNCTIONAL TESTS:  Eval: 5 times sit to stand: 14.9 sec Single Leg Stance:  Right- 4.7 sec, Left- 2.7 sec  05/31/2022: 5 times sit to stand: 12.5 sec Single Leg Stance:  Right-  22.8 sec, Left-  28.2 sec  TODAY'S TREATMENT:        DATE:  06/04/22 Pt seen for aquatic therapy today.  Treatment took place in water 3.25-4.5 ft in depth at the [facility]. Temp of water was 91.  Pt entered/exited the pool via stairs independently with single rail.  -Walking forward/backward with reciprocal arm swing; side stepping with bilat shoulder add/abdct (no floats) - standard stance with shoulder horizontal abdct/ addct x10, bilat shoulder addct x 10 - wall push ups/off x 10 x 2 - holding noodle:  leg swings into flexion / ext - holding wall:  hip abdct/ addct x 10 - TrA seet with kick board press down x10 - staggered stance with kick board push pull; repeated in wide stance - forward/backward marching with arm swing - hip hinge with forward arm reach, UE on yellow noodle  - SLS without use of arms x 20s each - warrior 1 - x 8 each leg forward - straddling noodle and cycling with breast stroke arms  Pt requires the buoyancy and hydrostatic  pressure of water for support, and to offload joints by unweighting joint load by at least 50 % in navel deep water and by at least 75-80% in chest to neck deep water.  Viscosity of the water is needed for resistance of strengthening. Water current perturbations provides challenge to standing balance requiring increased core activation. DATE: 05/31/2022 Nustep level 4 x6 min with PT present to discuss status Single leg stance, 5 times sit to stand, FOTO Sit to/from stand holding 5# kettlebell:  x10 with chest press, x10 with overhead press Seated core series with 5# kettlebell:  hip to hip, hip to opp shoulder, reverse seated crunch.  X10 each bilat Standing 3 way scapular stabilization with green loop x5 bilat Wall push ups 2x10 Standing lat pull-down 40# 2x10 Wall squats 2x5 Standing hamstring stretch at stairs 2x20 sec Standing calf stretch with heels hanging off step x20 sec   DATE: 05/28/22 Pt seen for aquatic therapy today.  Treatment took place in water 3.25-4.5 ft in depth at  the [facility]. Temp of water was 91.  Pt entered/exited the pool via stairs independently with single rail.  -Walking forward/backward with reciprocal arm swing; side stepping with bilat shoulder add/abdct (no floats) - wall push ups/off x 10; x 5  - staggered stance with bilat shoulder horiz abdct/ add - holding rainbow hand floats - UE support with rainbow hand floats:  LE hip abdct/add x 10; LE swings into hip flex/ext x 10 each - side step with arm abdct/add with rainbow hand floats; repeated with added side squat  - TrA set with short blue noodle pull down, with focus on scap depression with slow eccentric return.  - SLS without use of arms x 20s each - with fins donned on wrists:  staggered stance with bilat shoulder ER/IR; bilat shoulder flex/ext; bilat horiz abdct/ add.  In standard stance: reciprocal arm swing - high knee marching with reciprocal arm swing with fins donned  - staggered stance with kick board push pull Pt requires the buoyancy and hydrostatic pressure of water for support, and to offload joints by unweighting joint load by at least 50 % in navel deep water and by at least 75-80% in chest to neck deep water.  Viscosity of the water is needed for resistance of strengthening. Water current perturbations provides challenge to standing balance requiring increased core activation.   DATE: 05/21/2022  UBE level 1.0 x3 min each direction with PT present to discuss status Cervical rotation 3x20 seconds , sidbending and flexion 2x20 seconds  Standing 3 way scapular stabilization with green loop x5 bilat Seated with ball against back:  performing extension x10 Standing single leg stance 2x10 sec each Counter push-up 2x10 Manual Therapy:  soft tissue mobilization to cervical paraspinals and upper traps, manual trigger point release to upper traps, suboccipital release  PATIENT EDUCATION:  Education details: Issued aquatic HEP Person educated: Patient Education method: Explanation, Media planner, and Handouts Education comprehension: verbalized understanding and returned demonstration  HOME EXERCISE PROGRAM: Access Code: [email or web address] URL: [email or web address] Date: 05/01/2022 Prepared by: Shelby Dubin Menke  Exercises - Seated Cervical Retraction  - 3 x daily - 7 x weekly - 1 sets - 10 reps - 3-5 sec hold - Seated Cervical Extension AROM  - 1 x daily - 7 x weekly - 2 sets - 10 reps - Seated Cervical Sidebending AROM  - 3 x daily - 7 x weekly - 1 sets - 3 reps - 20 hold - Seated Cervical Rotation AROM  - 3 x daily - 7 x weekly - 1 sets - 3 reps - 20 hold - Seated Correct Posture  - 1 x daily - 7 x weekly - 3 sets - 10 reps - Standing Row with Anchored Resistance  - 1 x daily - 7 x weekly - 3 sets - 10 reps - Single Arm Shoulder Extension with Anchored Resistance  - 1 x daily - 7 x weekly - 3 sets - 10 reps - Wall Push Up  - 1 x daily - 7 x weekly - 2 sets - 10 reps  Access Code: [email or web address] URL: [email or web address] Date: 06/04/2022 Prepared by: [place]   ASSESSMENT:  CLINICAL IMPRESSION: Pt was issued aquatic HEP.  He tolerated all exercises well, without any increase in pain.  Pt verbalized readiness to continue land based PT, and will use aquatic HEP independently at [facility].  Patient is progressing well towards goal related activities.   OBJECTIVE IMPAIRMENTS: Abnormal gait, decreased balance, decreased strength, increased muscle spasms, impaired flexibility, postural dysfunction, and pain.   ACTIVITY LIMITATIONS: carrying, lifting, and bending  PARTICIPATION LIMITATIONS: community activity  PERSONAL FACTORS: Past/current experiences, Time since onset of injury/illness/exacerbation, and 1-2 comorbidities: OA, Diabetes  are also affecting patient's functional  outcome.   REHAB POTENTIAL: Good  CLINICAL DECISION MAKING: Evolving/moderate complexity  EVALUATION COMPLEXITY: Moderate   GOALS: Goals reviewed with patient? Yes  SHORT TERM GOALS: Target date: 05/17/2022  Pt will be independent with initial HEP. Baseline:  Goal status: MET  2.  Patient will report at least 30% improvement in symptoms/decreased pain since starting therapy. Baseline: 4/10 Goal status: MET   LONG TERM GOALS: Target date: 06/21/2022  Pt will be independent with advanced HEP and knowledge of independent progression. Baseline:  Goal status: IN PROGRESS  2.  Patient will increase FOTO to at least 57% to demonstrate improvements in functional mobility. Baseline: 53% Goal status: MET on 05/31/2022  3.  Patient will increase cervical A/ROM to [facility] to allow him to drive without any increased strain to neck. Baseline: see chart Goal status: IN PROGRESS  4.  Patient will increase left shoulder and hip strength to at least 5-/5 throughout to allow him to lift objects without difficulty. Baseline: 4 to 4+/5 Goal status: INITIAL  5.  Patient will increase bilateral single leg stance to at least 30 seconds to decrease risk of falling. Baseline: less than 5 sec on each side Goal status: IN PROGRESS (updated time on 05/31/2022)   PLAN:  PT FREQUENCY: 2x/week  PT DURATION: 8 weeks  PLANNED INTERVENTIONS: Therapeutic exercises, Therapeutic activity, Neuromuscular re-education, Balance training, Gait training, Patient/Family education, Self Care, Joint mobilization, Joint manipulation, Aquatic Therapy, Dry Needling, Electrical stimulation, Spinal manipulation, Spinal mobilization, Cryotherapy, Moist heat, Taping, Traction, Ultrasound, Ionotophoresis 80m/ml Dexamethasone, Manual therapy, and Re-evaluation  PLAN FOR NEXT SESSION: assess and progress HEP as indicated, strengthening, flexibility,  balance  Kerin Perna, PTA 06/04/22 3:41 PM [facility] [address] [place], [place], [address] Phone: [phone]   Fax:  [phone]

## 2022-06-06 ENCOUNTER — Ambulatory Visit: Payer: Medicare HMO | Admitting: Physical Therapy

## 2022-06-06 DIAGNOSIS — M542 Cervicalgia: Secondary | ICD-10-CM | POA: Diagnosis not present

## 2022-06-06 DIAGNOSIS — R252 Cramp and spasm: Secondary | ICD-10-CM

## 2022-06-06 DIAGNOSIS — M6281 Muscle weakness (generalized): Secondary | ICD-10-CM

## 2022-06-06 NOTE — Therapy (Signed)
OUTPATIENT PHYSICAL THERAPY TREATMENT NOTE   Patient Name: Mark Travis MRN: [id number] DOB:1950-12-23, 72 y.o., male Today's Date: 06/06/2022     END OF SESSION:  PT End of Session - 06/06/22 1441     Visit Number 12    Date for PT Re-Evaluation 06/21/22    Authorization Type [employer]    Authorization Time Period 05/01/2022 - 06/21/2022    Progress Note Due on Visit 20    PT Start Time 1445    PT Stop Time 1525    PT Time Calculation (min) 40 min    Activity Tolerance Patient tolerated treatment well                Past Medical History:  Diagnosis Date   Chronic low back pain    Decreased hearing    Diverticulosis    Emphysema (subcutaneous) (surgical) resulting from a procedure    Family history of cancer    Fatty liver    Glaucoma    Hiatal hernia    IBS (irritable bowel syndrome)    OSA on CPAP    Prostatitis    Seasonal allergies    Past Surgical History:  Procedure Laterality Date   COLONOSCOPY  10/2012   L trigger finger release     Patient Active Problem List   Diagnosis Date Noted   Genetic testing 05/03/2022   Family history of cancer 03/26/2022   Myalgia due to statin 03/15/2022   Coronary artery calcification 03/15/2022   Aortic atherosclerosis ([place]) 03/15/2022   Chest pain of uncertain etiology 99991111   Right carotid bruit 08/28/2021   Claudication of both lower extremities ([place]) 08/28/2021   Tobacco abuse 08/28/2021   Hypertension associated with diabetes ([place]) 08/28/2021   Allergic rhinitis due to animal (cat) (dog) hair and dander 08/07/2021   Allergic rhinitis due to pollen 08/07/2021   Chronic allergic conjunctivitis 08/07/2021   Chronic rhinitis 08/07/2021   Multiple thyroid nodules 05/09/2021   Nocturia 05/23/2020   Right knee pain 04/27/2020   Cervical radiculopathy 12/24/2019   Pulmonary nodule less than 6 cm determined by computed tomography of lung 11/25/2019   Hyperlipidemia 11/09/2019   Ptosis of right eyelid  05/27/2019   Deviated septum 03/31/2019   Type 2 diabetes mellitus ([place]) 03/23/2019   Chronic low back pain 03/19/2019   Fatty liver 03/19/2019   Other irritable bowel syndrome 03/19/2019   Other specified glaucoma 03/19/2019   Seasonal allergies 03/19/2019   Pulmonary emphysema ([place]) 12/09/2018   Loud snoring 12/09/2018   OSA and COPD overlap syndrome ([place]) 12/09/2018   Elevated prostate specific antigen (PSA) 05/27/2018   Patellofemoral syndrome of left knee 04/16/2017   Patellofemoral syndrome of right knee 04/16/2017   Primary open angle glaucoma of both eyes, moderate stage 11/11/2015   Cataract, nuclear, bilateral 09/18/2013   Degenerative progressive high myopia, bilateral 09/18/2013    PCP: Heywood Bene, PA-C  REFERRING PROVIDER: Kristeen Miss, MD  REFERRING DIAG: [phone] (ICD-10-CM) - Other spondylosis, cervical region  THERAPY DIAG:  Cervicalgia  Cramp and spasm  Muscle weakness (generalized)  Rationale for Evaluation and Treatment: Rehabilitation  ONSET DATE: chronic, but worse the past year  SUBJECTIVE:  SUBJECTIVE STATEMENT: Had an MRI on Monday and a massage yesterday.  I need to stretch out.  Using home traction but not working at 100%.  Finished aquatic PT now just land based.     PERTINENT HISTORY:  Diabetes, Hypertension, Glaucoma, OA  PAIN:  Are you having pain?Yes : NPRS scale:1/10 Pain location: base of skull Pain description: "just enough to know it's there".  Aggravating factors: sleeping, overhead activities Relieving factors: traction, thermal modalities  PRECAUTIONS: None  WEIGHT BEARING RESTRICTIONS: No  FALLS:  Has patient fallen in last 6 months? Yes. Number of falls 1 due to a vasovagal reaction when waking up and getting up  to go to the bathroom  LIVING ENVIRONMENT: Lives with: lives with their spouse Lives in: House/apartment Stairs: Yes: Internal: 15 steps; on right going up Has following equipment at home:  has purchased a shower grab bar, but has not installed yet  OCCUPATION: part-time tax Press photographer, works primarily during tax season  PLOF: Independent and Leisure: used to enjoy boating, photography, reading, gaming on computer  PATIENT GOALS: To have more consistency on decreased pain level.  NEXT MD VISIT: July 2024  OBJECTIVE:   DIAGNOSTIC FINDINGS:  Older MRI and Cervical Radiographs in 2022, will likely need updated pending visit in July 2024.  PATIENT SURVEYS:  Eval:  FOTO 53% (projected 57% by visit 11) 05/31/2022:  FOTO 66%  COGNITION: Overall cognitive status: Within functional limits for tasks assessed  SENSATION: Eval:  reports tingling down left arm and occasionally left leg  POSTURE: rounded shoulders and forward head  PALPATION: Eval:  Mild tightness and spasms with cervical paraspinals and upper traps   CERVICAL ROM:   Active ROM A/ROM (deg) eval A/ROM 05/16/22 A/ROM (Deg) 05/31/22  Flexion 50  50  Extension 30  42  Right lateral flexion 20 28 30  $ Left lateral flexion 15 20 25  $ Right rotation 40 50 55  Left rotation 45 50 55   (Blank rows = not tested)  UPPER EXTREMITY ROM:  WFL  UPPER EXTREMITY MMT: Eval:   Right UE is 5/5, Left shoulder is 4+ to 5-/5 Right hip strength WFL, Left hip is 4 to 4+/5  FUNCTIONAL TESTS:  Eval: 5 times sit to stand: 14.9 sec Single Leg Stance:  Right- 4.7 sec, Left- 2.7 sec  05/31/2022: 5 times sit to stand: 12.5 sec Single Leg Stance:  Right-  22.8 sec, Left-  28.2 sec  TODAY'S TREATMENT:        DATE: 06/06/2022 UBE 2 forward/2 backward with PT present to discuss status Seated open books 5x right/left Seated thoracic extension with ball 8x Counter push ups 5x2 Sit to stand with 5# kettlebell press overhead 10x Seated  5# kettlebell bicep curls 5x each side Seated 5# shoulder forward and lateral raises 3x each side Standing lat pull-down 40# 2x10 Manual Therapy:  soft tissue mobilization to cervical paraspinals and upper traps, manual trigger point release to upper traps, suboccipital release    DATE: 06/04/22 Pt seen for aquatic therapy today.  Treatment took place in water 3.25-4.5 ft in depth at the [facility]. Temp of water was 91.  Pt entered/exited the pool via stairs independently with single rail.  -Walking forward/backward with reciprocal arm swing; side stepping with bilat shoulder add/abdct (no floats) - standard stance with shoulder horizontal abdct/ addct x10, bilat shoulder addct x 10 - wall push ups/off x 10 x 2 - holding noodle:  leg swings into flexion / ext - holding  wall:  hip abdct/ addct x 10 - TrA seet with kick board press down x10 - staggered stance with kick board push pull; repeated in wide stance - forward/backward marching with arm swing - hip hinge with forward arm reach, UE on yellow noodle  - SLS without use of arms x 20s each - warrior 1 - x 8 each leg forward - straddling noodle and cycling with breast stroke arms  Pt requires the buoyancy and hydrostatic pressure of water for support, and to offload joints by unweighting joint load by at least 50 % in navel deep water and by at least 75-80% in chest to neck deep water.  Viscosity of the water is needed for resistance of strengthening. Water current perturbations provides challenge to standing balance requiring increased core activation. DATE: 05/31/2022 Nustep level 4 x6 min with PT present to discuss status Single leg stance, 5 times sit to stand, FOTO Sit to/from stand holding 5# kettlebell:  x10 with chest press, x10 with overhead press Seated core series with 5# kettlebell:  hip to hip, hip to opp shoulder, reverse seated crunch.  X10 each bilat Standing 3 way scapular stabilization with green loop x5  bilat Wall push ups 2x10 Standing lat pull-down 40# 2x10 Wall squats 2x5 Standing hamstring stretch at stairs 2x20 sec Standing calf stretch with heels hanging off step x20 sec   DATE: 05/28/22 Pt seen for aquatic therapy today.  Treatment took place in water 3.25-4.5 ft in depth at the [facility]. Temp of water was 91.  Pt entered/exited the pool via stairs independently with single rail.  -Walking forward/backward with reciprocal arm swing; side stepping with bilat shoulder add/abdct (no floats) - wall push ups/off x 10; x 5  - staggered stance with bilat shoulder horiz abdct/ add - holding rainbow hand floats - UE support with rainbow hand floats:  LE hip abdct/add x 10; LE swings into hip flex/ext x 10 each - side step with arm abdct/add with rainbow hand floats; repeated with added side squat  - TrA set with short blue noodle pull down, with focus on scap depression with slow eccentric return.  - SLS without use of arms x 20s each - with fins donned on wrists:  staggered stance with bilat shoulder ER/IR; bilat shoulder flex/ext; bilat horiz abdct/ add.  In standard stance: reciprocal arm swing - high knee marching with reciprocal arm swing with fins donned  - staggered stance with kick board push pull Pt requires the buoyancy and hydrostatic pressure of water for support, and to offload joints by unweighting joint load by at least 50 % in navel deep water and by at least 75-80% in chest to neck deep water.  Viscosity of the water is needed for resistance of strengthening. Water current perturbations provides challenge to standing balance requiring increased core activation.   DATE: 05/21/2022  UBE level 1.0 x3 min each direction with PT present to discuss status Cervical rotation 3x20 seconds , sidbending and flexion 2x20 seconds  Standing 3 way scapular stabilization with green loop x5 bilat Seated with ball against back:  performing extension x10 Standing single leg  stance 2x10 sec each Counter push-up 2x10 Manual Therapy:  soft tissue mobilization to cervical paraspinals and upper traps, manual trigger point release to upper traps, suboccipital release  PATIENT EDUCATION:  Education details: Issued aquatic HEP Person educated: Patient Education method: Explanation, Media planner, and Handouts Education comprehension: verbalized understanding and returned demonstration  HOME EXERCISE PROGRAM: Access Code: [email or web address] URL: [email or web address] Date: 05/01/2022 Prepared by: Shelby Dubin Menke  Exercises - Seated Cervical Retraction  - 3 x daily - 7 x weekly - 1 sets - 10 reps - 3-5 sec hold - Seated Cervical Extension AROM  - 1 x daily - 7 x weekly - 2 sets - 10 reps - Seated Cervical Sidebending AROM  - 3 x daily - 7 x weekly - 1 sets - 3 reps - 20 hold - Seated Cervical Rotation AROM  - 3 x daily - 7 x weekly - 1 sets - 3 reps - 20 hold - Seated Correct Posture  - 1 x daily - 7 x weekly - 3 sets - 10 reps - Standing Row with Anchored Resistance  - 1 x daily - 7 x weekly - 3 sets - 10 reps - Single Arm Shoulder Extension with Anchored Resistance  - 1 x daily - 7 x weekly - 3 sets - 10 reps - Wall Push Up  - 1 x daily - 7 x weekly - 2 sets - 10 reps  Access Code: [email or web address] URL: [email or web address] Date: 06/04/2022 Prepared by: [facility] - Outpatient Rehab - [address] AQUATIC   ASSESSMENT:  CLINICAL IMPRESSION: Discussed strategies including frequent change of position and dynamic ex's to manage symptoms during work (tax season).  Good response to limited reps for shoulder/scapular muscle strengthening.  Improved soft tissue mobility particularly suboccipitals with manual therapy.  Therapist monitoring response throughout session and modifying treatment accordingly.      OBJECTIVE IMPAIRMENTS:  Abnormal gait, decreased balance, decreased strength, increased muscle spasms, impaired flexibility, postural dysfunction, and pain.   ACTIVITY LIMITATIONS: carrying, lifting, and bending  PARTICIPATION LIMITATIONS: community activity  PERSONAL FACTORS: Past/current experiences, Time since onset of injury/illness/exacerbation, and 1-2 comorbidities: OA, Diabetes  are also affecting patient's functional outcome.   REHAB POTENTIAL: Good  CLINICAL DECISION MAKING: Evolving/moderate complexity  EVALUATION COMPLEXITY: Moderate   GOALS: Goals reviewed with patient? Yes  SHORT TERM GOALS: Target date: 05/17/2022  Pt will be independent with initial HEP. Baseline:  Goal status: MET  2.  Patient will report at least 30% improvement in symptoms/decreased pain since starting therapy. Baseline: 4/10 Goal status: MET   LONG TERM GOALS: Target date: 06/21/2022  Pt will be independent with advanced HEP and knowledge of independent progression. Baseline:  Goal status: IN PROGRESS  2.  Patient will increase FOTO to at least 57% to demonstrate improvements in functional mobility. Baseline: 53% Goal status: MET on 05/31/2022  3.  Patient will increase cervical A/ROM to [facility] to allow him to drive without any increased strain to neck. Baseline: see chart Goal status: IN PROGRESS  4.  Patient will increase left shoulder and hip strength to at least 5-/5 throughout to allow him to lift objects without difficulty. Baseline: 4 to 4+/5 Goal status: INITIAL  5.  Patient will increase bilateral single leg stance to at least 30 seconds to decrease risk of falling. Baseline: less than 5 sec on each side Goal status: IN PROGRESS (updated time on 05/31/2022)   PLAN:  PT FREQUENCY: 2x/week  PT DURATION: 8 weeks  PLANNED INTERVENTIONS: Therapeutic exercises, Therapeutic activity, Neuromuscular re-education, Balance training, Gait training, Patient/Family education, Self Care, Joint mobilization,  Joint manipulation, Aquatic Therapy, Dry Needling, Electrical stimulation, Spinal manipulation, Spinal mobilization, Cryotherapy, Moist heat, Taping, Traction,  Ultrasound, Ionotophoresis 62m/ml Dexamethasone, Manual therapy, and Re-evaluation  PLAN FOR NEXT SESSION: assess and progress HEP as indicated, strengthening, flexibility,  balance   SRuben Im PT 06/06/22 5:30 PM Phone: [phone]: [phone]

## 2022-06-11 ENCOUNTER — Ambulatory Visit (HOSPITAL_BASED_OUTPATIENT_CLINIC_OR_DEPARTMENT_OTHER): Payer: Medicare HMO | Admitting: Physical Therapy

## 2022-06-13 ENCOUNTER — Encounter: Payer: Self-pay | Admitting: Rehabilitative and Restorative Service Providers"

## 2022-06-13 ENCOUNTER — Ambulatory Visit: Payer: Medicare HMO | Admitting: Rehabilitative and Restorative Service Providers"

## 2022-06-13 DIAGNOSIS — R252 Cramp and spasm: Secondary | ICD-10-CM

## 2022-06-13 DIAGNOSIS — M6281 Muscle weakness (generalized): Secondary | ICD-10-CM

## 2022-06-13 DIAGNOSIS — M542 Cervicalgia: Secondary | ICD-10-CM | POA: Diagnosis not present

## 2022-06-13 DIAGNOSIS — R2689 Other abnormalities of gait and mobility: Secondary | ICD-10-CM

## 2022-06-13 DIAGNOSIS — M79602 Pain in left arm: Secondary | ICD-10-CM

## 2022-06-13 NOTE — Therapy (Signed)
OUTPATIENT PHYSICAL THERAPY TREATMENT NOTE   Patient Name: Mark Travis MRN: [id number] DOB:1951-01-03, 72 y.o., male Today's Date: 06/13/2022     END OF SESSION:  PT End of Session - 06/13/22 1227     Visit Number 13    Date for PT Re-Evaluation 06/21/22    Authorization Type [employer]    Authorization Time Period 05/01/2022 - 06/21/2022    Authorization - Visit Number 11    Authorization - Number of Visits 16    Progress Note Due on Visit 20    PT Start Time 1229    PT Stop Time 1310    PT Time Calculation (min) 41 min    Activity Tolerance Patient tolerated treatment well    Behavior During Therapy WFL for tasks assessed/performed                Past Medical History:  Diagnosis Date   Chronic low back pain    Decreased hearing    Diverticulosis    Emphysema (subcutaneous) (surgical) resulting from a procedure    Family history of cancer    Fatty liver    Glaucoma    Hiatal hernia    IBS (irritable bowel syndrome)    OSA on CPAP    Prostatitis    Seasonal allergies    Past Surgical History:  Procedure Laterality Date   COLONOSCOPY  10/2012   L trigger finger release     Patient Active Problem List   Diagnosis Date Noted   Genetic testing 05/03/2022   Family history of cancer 03/26/2022   Myalgia due to statin 03/15/2022   Coronary artery calcification 03/15/2022   Aortic atherosclerosis ([place]) 03/15/2022   Chest pain of uncertain etiology 99991111   Right carotid bruit 08/28/2021   Claudication of both lower extremities ([place]) 08/28/2021   Tobacco abuse 08/28/2021   Hypertension associated with diabetes ([place]) 08/28/2021   Allergic rhinitis due to animal (cat) (dog) hair and dander 08/07/2021   Allergic rhinitis due to pollen 08/07/2021   Chronic allergic conjunctivitis 08/07/2021   Chronic rhinitis 08/07/2021   Multiple thyroid nodules 05/09/2021   Nocturia 05/23/2020   Right knee pain 04/27/2020   Cervical radiculopathy 12/24/2019    Pulmonary nodule less than 6 cm determined by computed tomography of lung 11/25/2019   Hyperlipidemia 11/09/2019   Ptosis of right eyelid 05/27/2019   Deviated septum 03/31/2019   Type 2 diabetes mellitus ([place]) 03/23/2019   Chronic low back pain 03/19/2019   Fatty liver 03/19/2019   Other irritable bowel syndrome 03/19/2019   Other specified glaucoma 03/19/2019   Seasonal allergies 03/19/2019   Pulmonary emphysema ([place]) 12/09/2018   Loud snoring 12/09/2018   OSA and COPD overlap syndrome ([place]) 12/09/2018   Elevated prostate specific antigen (PSA) 05/27/2018   Patellofemoral syndrome of left knee 04/16/2017   Patellofemoral syndrome of right knee 04/16/2017   Primary open angle glaucoma of both eyes, moderate stage 11/11/2015   Cataract, nuclear, bilateral 09/18/2013   Degenerative progressive high myopia, bilateral 09/18/2013    PCP: Heywood Bene, PA-C  REFERRING PROVIDER: Kristeen Miss, MD  REFERRING DIAG: [phone] (ICD-10-CM) - Other spondylosis, cervical region  THERAPY DIAG:  Cervicalgia  Cramp and spasm  Muscle weakness (generalized)  Pain in left arm  Other abnormalities of gait and mobility  Rationale for Evaluation and Treatment: Rehabilitation  ONSET DATE: chronic, but worse the past year  SUBJECTIVE:  SUBJECTIVE STATEMENT: Pt reports that his lower back has been hurting since his MRI.  Denies any cervical pain.   PERTINENT HISTORY:  Diabetes, Hypertension, Glaucoma, OA  PAIN:  Are you having pain?Yes : NPRS scale: 3-4/10 Pain location: low back Pain description: soreness   PRECAUTIONS: None  WEIGHT BEARING RESTRICTIONS: No  FALLS:  Has patient fallen in last 6 months? Yes. Number of falls 1 due to a vasovagal reaction when waking up and  getting up to go to the bathroom  LIVING ENVIRONMENT: Lives with: lives with their spouse Lives in: House/apartment Stairs: Yes: Internal: 15 steps; on right going up Has following equipment at home:  has purchased a shower grab bar, but has not installed yet  OCCUPATION: part-time tax Press photographer, works primarily during tax season  PLOF: Independent and Leisure: used to enjoy boating, photography, reading, gaming on computer  PATIENT GOALS: To have more consistency on decreased pain level.  NEXT MD VISIT: July 2024  OBJECTIVE:   DIAGNOSTIC FINDINGS:  Older MRI and Cervical Radiographs in 2022, will likely need updated pending visit in July 2024.  PATIENT SURVEYS:  Eval:  FOTO 53% (projected 57% by visit 11) 05/31/2022:  FOTO 66%  COGNITION: Overall cognitive status: Within functional limits for tasks assessed  SENSATION: Eval:  reports tingling down left arm and occasionally left leg  POSTURE: rounded shoulders and forward head  PALPATION: Eval:  Mild tightness and spasms with cervical paraspinals and upper traps   CERVICAL ROM:   Active ROM A/ROM (deg) eval A/ROM 05/16/22 A/ROM (Deg) 05/31/22  Flexion 50  50  Extension 30  42  Right lateral flexion '20 28 30  '$ Left lateral flexion '15 20 25  '$ Right rotation 40 50 55  Left rotation 45 50 55   (Blank rows = not tested)  UPPER EXTREMITY ROM:  WFL  UPPER EXTREMITY MMT: Eval:   Right UE is 5/5, Left shoulder is 4+ to 5-/5 Right hip strength WFL, Left hip is 4 to 4+/5  FUNCTIONAL TESTS:  Eval: 5 times sit to stand: 14.9 sec Single Leg Stance:  Right- 4.7 sec, Left- 2.7 sec  05/31/2022: 5 times sit to stand: 12.5 sec Single Leg Stance:  Right-  22.8 sec, Left-  28.2 sec  TODAY'S TREATMENT:         DATE: 06/13/2022 Nustep level 1 x6 min with PT present to discuss status Standing hamstring stretch at stairs 2x20 sec Standing calf stretch with heels hanging off step x20 sec Sit to/from stand holding 6# dumbell:   x10 with chest press, x10 with overhead press Standing 3 way scapular stabilization with blue loop 2x5 bilat Wall squats 2x5 Standing lat pull-down 40# 2x10 Wall push ups 2x10 Attempted side step up and over bosu x3 (unable to complete more secondary to decreased balance and hip pain) Side step up and over purple foam x7 Educated in using white hard foam on right hip/IT band for myofascial release Manual Therapy:  soft tissue mobilization to cervical paraspinals and upper traps, manual trigger point release to upper traps, suboccipital release   DATE: 06/06/2022 UBE 2 forward/2 backward with PT present to discuss status Seated open books 5x right/left Seated thoracic extension with ball 8x Counter push ups 5x2 Sit to stand with 5# kettlebell press overhead 10x Seated 5# kettlebell bicep curls 5x each side Seated 5# shoulder forward and lateral raises 3x each side Standing lat pull-down 40# 2x10 Manual Therapy:  soft tissue mobilization to cervical paraspinals and upper traps, manual  trigger point release to upper traps, suboccipital release    DATE: 06/04/22 Pt seen for aquatic therapy today.  Treatment took place in water 3.25-4.5 ft in depth at the [facility]. Temp of water was 91.  Pt entered/exited the pool via stairs independently with single rail.  -Walking forward/backward with reciprocal arm swing; side stepping with bilat shoulder add/abdct (no floats) - standard stance with shoulder horizontal abdct/ addct x10, bilat shoulder addct x 10 - wall push ups/off x 10 x 2 - holding noodle:  leg swings into flexion / ext - holding wall:  hip abdct/ addct x 10 - TrA seet with kick board press down x10 - staggered stance with kick board push pull; repeated in wide stance - forward/backward marching with arm swing - hip hinge with forward arm reach, UE on yellow noodle  - SLS without use of arms x 20s each - warrior 1 - x 8 each leg forward - straddling noodle and  cycling with breast stroke arms  Pt requires the buoyancy and hydrostatic pressure of water for support, and to offload joints by unweighting joint load by at least 50 % in navel deep water and by at least 75-80% in chest to neck deep water.  Viscosity of the water is needed for resistance of strengthening. Water current perturbations provides challenge to standing balance requiring increased core activation.                                                                                                                         PATIENT EDUCATION:  Education details: Issued aquatic HEP Person educated: Patient Education method: Explanation, Media planner, and Handouts Education comprehension: verbalized understanding and returned demonstration  HOME EXERCISE PROGRAM: Access Code: [email or web address] URL: [email or web address] Date: 05/01/2022 Prepared by: Shelby Dubin Ferrin Liebig  Exercises - Seated Cervical Retraction  - 3 x daily - 7 x weekly - 1 sets - 10 reps - 3-5 sec hold - Seated Cervical Extension AROM  - 1 x daily - 7 x weekly - 2 sets - 10 reps - Seated Cervical Sidebending AROM  - 3 x daily - 7 x weekly - 1 sets - 3 reps - 20 hold - Seated Cervical Rotation AROM  - 3 x daily - 7 x weekly - 1 sets - 3 reps - 20 hold - Seated Correct Posture  - 1 x daily - 7 x weekly - 3 sets - 10 reps - Standing Row with Anchored Resistance  - 1 x daily - 7 x weekly - 3 sets - 10 reps - Single Arm Shoulder Extension with Anchored Resistance  - 1 x daily - 7 x weekly - 3 sets - 10 reps - Wall Push Up  - 1 x daily - 7 x weekly - 2 sets - 10 reps  Access Code: [email or web address] URL: [email or web address] Date: 06/04/2022 Prepared by: [place]   ASSESSMENT:  CLINICAL IMPRESSION: Mr Devon presents to skilled PT reporting decreased  cervical pain overall, but does state some increased low back pain after his MRI.  Patient with difficulty with balance and  increased pain with step over bosu, so changed to purple foam and pt was able to easier complete.  Patient continues to progress with postural and scapular strengthening.  Patient with reports of feeling looser following manual therapy at completion of session.  Will reassess pt next visit to assess for potential need for further therapy.   OBJECTIVE IMPAIRMENTS: Abnormal gait, decreased balance, decreased strength, increased muscle spasms, impaired flexibility, postural dysfunction, and pain.   ACTIVITY LIMITATIONS: carrying, lifting, and bending  PARTICIPATION LIMITATIONS: community activity  PERSONAL FACTORS: Past/current experiences, Time since onset of injury/illness/exacerbation, and 1-2 comorbidities: OA, Diabetes  are also affecting patient's functional outcome.   REHAB POTENTIAL: Good  CLINICAL DECISION MAKING: Evolving/moderate complexity  EVALUATION COMPLEXITY: Moderate   GOALS: Goals reviewed with patient? Yes  SHORT TERM GOALS: Target date: 05/17/2022  Pt will be independent with initial HEP. Baseline:  Goal status: MET  2.  Patient will report at least 30% improvement in symptoms/decreased pain since starting therapy. Baseline: 4/10 Goal status: MET   LONG TERM GOALS: Target date: 06/21/2022  Pt will be independent with advanced HEP and knowledge of independent progression. Baseline:  Goal status: IN PROGRESS  2.  Patient will increase FOTO to at least 57% to demonstrate improvements in functional mobility. Baseline: 53% Goal status: MET on 05/31/2022  3.  Patient will increase cervical A/ROM to [facility] to allow him to drive without any increased strain to neck. Baseline: see chart Goal status: IN PROGRESS  4.  Patient will increase left shoulder and hip strength to at least 5-/5 throughout to allow him to lift objects without difficulty. Baseline: 4 to 4+/5 Goal status: INITIAL  5.  Patient will increase bilateral single leg stance to at least 30 seconds to  decrease risk of falling. Baseline: less than 5 sec on each side Goal status: IN PROGRESS (updated time on 05/31/2022)   PLAN:  PT FREQUENCY: 2x/week  PT DURATION: 8 weeks  PLANNED INTERVENTIONS: Therapeutic exercises, Therapeutic activity, Neuromuscular re-education, Balance training, Gait training, Patient/Family education, Self Care, Joint mobilization, Joint manipulation, Aquatic Therapy, Dry Needling, Electrical stimulation, Spinal manipulation, Spinal mobilization, Cryotherapy, Moist heat, Taping, Traction, Ultrasound, Ionotophoresis '4mg'$ /ml Dexamethasone, Manual therapy, and Re-evaluation  PLAN FOR NEXT SESSION: assess and progress HEP as indicated, strengthening, flexibility,  balance   Juel Burrow, PT 06/13/22 1:22 PM  [facility] [address] [place],  [address] Phone # [phone] Fax [phone]

## 2022-06-18 ENCOUNTER — Ambulatory Visit (HOSPITAL_BASED_OUTPATIENT_CLINIC_OR_DEPARTMENT_OTHER): Payer: Medicare HMO | Admitting: Physical Therapy

## 2022-06-20 ENCOUNTER — Ambulatory Visit: Payer: Medicare HMO | Attending: Neurological Surgery | Admitting: Rehabilitative and Restorative Service Providers"

## 2022-06-20 ENCOUNTER — Encounter: Payer: Self-pay | Admitting: Rehabilitative and Restorative Service Providers"

## 2022-06-20 DIAGNOSIS — M6281 Muscle weakness (generalized): Secondary | ICD-10-CM | POA: Insufficient documentation

## 2022-06-20 DIAGNOSIS — M79602 Pain in left arm: Secondary | ICD-10-CM | POA: Diagnosis present

## 2022-06-20 DIAGNOSIS — R2689 Other abnormalities of gait and mobility: Secondary | ICD-10-CM | POA: Diagnosis present

## 2022-06-20 DIAGNOSIS — R252 Cramp and spasm: Secondary | ICD-10-CM | POA: Diagnosis present

## 2022-06-20 DIAGNOSIS — M542 Cervicalgia: Secondary | ICD-10-CM | POA: Diagnosis present

## 2022-06-20 NOTE — Therapy (Signed)
OUTPATIENT PHYSICAL THERAPY TREATMENT NOTE AND DISCHARGE SUMMARY   Patient Name: Mark Travis MRN: [id number] DOB:[birth year], 72 y.o., male Today's Date: 06/20/2022     END OF SESSION:  PT End of Session - 06/20/22 1232     Visit Number 14    Date for PT Re-Evaluation 06/21/22    Authorization Type [employer]    Authorization Time Period 05/01/2022 - 06/21/2022    Authorization - Visit Number 12    Authorization - Number of Visits 16    Progress Note Due on Visit 20    PT Start Time 1230    PT Stop Time 1310    PT Time Calculation (min) 40 min    Activity Tolerance Patient tolerated treatment well    Behavior During Therapy WFL for tasks assessed/performed                Past Medical History:  Diagnosis Date   Chronic low back pain    Decreased hearing    Diverticulosis    Emphysema (subcutaneous) (surgical) resulting from a procedure    Family history of cancer    Fatty liver    Glaucoma    Hiatal hernia    IBS (irritable bowel syndrome)    OSA on CPAP    Prostatitis    Seasonal allergies    Past Surgical History:  Procedure Laterality Date   COLONOSCOPY  10/2012   L trigger finger release     Patient Active Problem List   Diagnosis Date Noted   Genetic testing 05/03/2022   Family history of cancer 03/26/2022   Myalgia due to statin 03/15/2022   Coronary artery calcification 03/15/2022   Aortic atherosclerosis ([place]) 03/15/2022   Chest pain of uncertain etiology 99991111   Right carotid bruit 08/28/2021   Claudication of both lower extremities ([place]) 08/28/2021   Tobacco abuse 08/28/2021   Hypertension associated with diabetes ([place]) 08/28/2021   Allergic rhinitis due to animal (cat) (dog) hair and dander 08/07/2021   Allergic rhinitis due to pollen 08/07/2021   Chronic allergic conjunctivitis 08/07/2021   Chronic rhinitis 08/07/2021   Multiple thyroid nodules 05/09/2021   Nocturia 05/23/2020   Right knee pain 04/27/2020   Cervical  radiculopathy 12/24/2019   Pulmonary nodule less than 6 cm determined by computed tomography of lung 11/25/2019   Hyperlipidemia 11/09/2019   Ptosis of right eyelid 05/27/2019   Deviated septum 03/31/2019   Type 2 diabetes mellitus ([place]) 03/23/2019   Chronic low back pain 03/19/2019   Fatty liver 03/19/2019   Other irritable bowel syndrome 03/19/2019   Other specified glaucoma 03/19/2019   Seasonal allergies 03/19/2019   Pulmonary emphysema ([place]) 12/09/2018   Loud snoring 12/09/2018   OSA and COPD overlap syndrome ([place]) 12/09/2018   Elevated prostate specific antigen (PSA) 05/27/2018   Patellofemoral syndrome of left knee 04/16/2017   Patellofemoral syndrome of right knee 04/16/2017   Primary open angle glaucoma of both eyes, moderate stage 11/11/2015   Cataract, nuclear, bilateral 09/18/2013   Degenerative progressive high myopia, bilateral 09/18/2013    PCP: Heywood Bene, PA-C  REFERRING PROVIDER: Kristeen Miss, MD  REFERRING DIAG: [phone] (ICD-10-CM) - Other spondylosis, cervical region  THERAPY DIAG:  Cervicalgia  Cramp and spasm  Muscle weakness (generalized)  Pain in left arm  Other abnormalities of gait and mobility  Rationale for Evaluation and Treatment: Rehabilitation  ONSET DATE: chronic, but worse the past year  SUBJECTIVE:  SUBJECTIVE STATEMENT: Pt states that he feels like he is ready for discharge from PT at this time, states that he can continue with his HEP.   PERTINENT HISTORY:  Diabetes, Hypertension, Glaucoma, OA  PAIN:  Are you having pain?No   PRECAUTIONS: None  WEIGHT BEARING RESTRICTIONS: No  FALLS:  Has patient fallen in last 6 months? Yes. Number of falls 1 due to a vasovagal reaction when waking up and getting up to go to the  bathroom  LIVING ENVIRONMENT: Lives with: lives with their spouse Lives in: House/apartment Stairs: Yes: Internal: 15 steps; on right going up Has following equipment at home:  has purchased a shower grab bar, but has not installed yet  OCCUPATION: part-time tax Press photographer, works primarily during tax season  PLOF: Independent and Leisure: used to enjoy boating, photography, reading, gaming on computer  PATIENT GOALS: To have more consistency on decreased pain level.  NEXT MD VISIT: July 2024  OBJECTIVE:   DIAGNOSTIC FINDINGS:  Older MRI and Cervical Radiographs in 2022, will likely need updated pending visit in July 2024.  PATIENT SURVEYS:  Eval:  FOTO 53% (projected 57% by visit 11) 05/31/2022:  FOTO 66%  COGNITION: Overall cognitive status: Within functional limits for tasks assessed  SENSATION: Eval:  reports tingling down left arm and occasionally left leg  POSTURE: rounded shoulders and forward head  PALPATION: Eval:  Mild tightness and spasms with cervical paraspinals and upper traps   CERVICAL ROM:   Active ROM A/ROM (deg) eval A/ROM 05/16/22 A/ROM (Deg) 05/31/22  Flexion 50  50  Extension 30  42  Right lateral flexion '20 28 30  '$ Left lateral flexion '15 20 25  '$ Right rotation 40 50 55  Left rotation 45 50 55   (Blank rows = not tested)  UPPER EXTREMITY ROM:  WFL  UPPER EXTREMITY MMT: Eval:   Right UE is 5/5, Left shoulder is 4+ to 5-/5 Right hip strength WFL, Left hip is 4 to 4+/5  06/20/2022: BUE and LE strength grossly 5- to 5/5 throughout  FUNCTIONAL TESTS:  Eval: 5 times sit to stand: 14.9 sec Single Leg Stance:  Right- 4.7 sec, Left- 2.7 sec  05/31/2022: 5 times sit to stand: 12.5 sec Single Leg Stance:  Right-  22.8 sec, Left-  28.2 sec  06/20/2022: Single Leg Stance:  Right-  10 sec, Left-  11 sec  TODAY'S TREATMENT:         DATE: 06/20/2022 Nustep level 3 x6 min with PT present to discuss status Sidelying open book x10 bilat Single leg  stance 5x max achievable seconds each (max of 11 seconds) Standing hamstring stretch at stairs 2x20 sec Standing calf stretch with heels hanging off step x20 sec Standing 3 way scapular stabilization with blue loop 2x5 bilat Wall squats 2x5 Standing overhead side-bend x10 bilat Standing lat pull-down 40# 2x10 Tandem gait 4x10 ft Manual Therapy:  soft tissue mobilization to cervical paraspinals and upper traps, manual trigger point release to upper traps, suboccipital release   DATE: 06/13/2022 Nustep level 1 x6 min with PT present to discuss status Standing hamstring stretch at stairs 2x20 sec Standing calf stretch with heels hanging off step x20 sec Sit to/from stand holding 6# dumbell:  x10 with chest press, x10 with overhead press Standing 3 way scapular stabilization with blue loop 2x5 bilat Wall squats 2x5 Standing lat pull-down 40# 2x10 Wall push ups 2x10 Attempted side step up and over bosu x3 (unable to complete more secondary to decreased balance and  hip pain) Side step up and over purple foam x7 Educated in using white hard foam on right hip/IT band for myofascial release Manual Therapy:  soft tissue mobilization to cervical paraspinals and upper traps, manual trigger point release to upper traps, suboccipital release   DATE: 06/06/2022 UBE 2 forward/2 backward with PT present to discuss status Seated open books 5x right/left Seated thoracic extension with ball 8x Counter push ups 5x2 Sit to stand with 5# kettlebell press overhead 10x Seated 5# kettlebell bicep curls 5x each side Seated 5# shoulder forward and lateral raises 3x each side Standing lat pull-down 40# 2x10 Manual Therapy:  soft tissue mobilization to cervical paraspinals and upper traps, manual trigger point release to upper traps, suboccipital release                                                                                                                          PATIENT EDUCATION:  Education  details: Issued aquatic HEP Person educated: Patient Education method: Consulting civil engineer, Media planner, and Handouts Education comprehension: verbalized understanding and returned demonstration  HOME EXERCISE PROGRAM: Access Code: [email or web address] URL: [email or web address] Date: 06/20/2022 Prepared by: Shelby Dubin Chasitty Hehl  Exercises - Seated Cervical Retraction  - 3 x daily - 7 x weekly - 1 sets - 10 reps - 3-5 sec hold - Seated Cervical Extension AROM  - 1 x daily - 7 x weekly - 2 sets - 10 reps - Seated Cervical Sidebending AROM  - 3 x daily - 7 x weekly - 1 sets - 3 reps - 20 hold - Seated Cervical Rotation AROM  - 3 x daily - 7 x weekly - 1 sets - 3 reps - 20 hold - Seated Correct Posture  - 1 x daily - 7 x weekly - 3 sets - 10 reps - Standing Row with Anchored Resistance  - 1 x daily - 7 x weekly - 3 sets - 10 reps - Single Arm Shoulder Extension with Anchored Resistance  - 1 x daily - 7 x weekly - 3 sets - 10 reps - Wall Push Up  - 1 x daily - 7 x weekly - 2 sets - 10 reps - Wall Squat  - 1 x daily - 7 x weekly - 5 sets - 5 reps - Single Leg Stance  - 1 x daily - 7 x weekly - 1 sets - 3 reps - 30 sec hold - Walking Tandem Stance  - 1 x daily - 7 x weekly - 3 sets - 10 reps - Standing Hamstring Stretch on Chair  - 1 x daily - 7 x weekly - 1 sets - 2 reps - 20 sec hold - Standing Gastroc Stretch on Step  - 1 x daily - 7 x weekly - 1 sets - 2 reps - 20 sec hold - Sidelying Thoracic Rotation with Open Book  - 1 x daily - 7 x weekly - 1 sets - 10 reps  Access Code: [email or web address] URL: [email or web address] Date: 06/04/2022  Prepared by: [facility] - [address] AQUATIC   ASSESSMENT:  CLINICAL IMPRESSION: Mr Character presents to skilled PT reporting continued progress towards goals.  Patient reports that he has been doing single leg stance at home, but he has been stopping at 10 seconds.  Patient had initially met goal for single leg balance (of greater than 20  seconds), but did not meet stretch goal of 30 seconds.  Patient has increased strength and was able to perform tandem gait without difficulty.  Patient states that he is ready to progress with HEP and no longer feels that he needs outpatient PT.  Patient reports that he is compliant with HEP and is being more mindful of his stretches at home, especially during tax season.  Patient is ready for discharge from skilled PT.   OBJECTIVE IMPAIRMENTS: Abnormal gait, decreased balance, decreased strength, increased muscle spasms, impaired flexibility, postural dysfunction, and pain.   ACTIVITY LIMITATIONS: carrying, lifting, and bending  PARTICIPATION LIMITATIONS: community activity  PERSONAL FACTORS: Past/current experiences, Time since onset of injury/illness/exacerbation, and 1-2 comorbidities: OA, Diabetes  are also affecting patient's functional outcome.   REHAB POTENTIAL: Good  CLINICAL DECISION MAKING: Evolving/moderate complexity  EVALUATION COMPLEXITY: Moderate   GOALS: Goals reviewed with patient? Yes  SHORT TERM GOALS: Target date: 05/17/2022  Pt will be independent with initial HEP. Baseline:  Goal status: MET  2.  Patient will report at least 30% improvement in symptoms/decreased pain since starting therapy. Baseline: 4/10 Goal status: MET   LONG TERM GOALS: Target date: 06/21/2022  Pt will be independent with advanced HEP and knowledge of independent progression. Baseline:  Goal status: MET on 06/20/2022  2.  Patient will increase FOTO to at least 57% to demonstrate improvements in functional mobility. Baseline: 53% Goal status: MET on 05/31/2022  3.  Patient will increase cervical A/ROM to [facility] to allow him to drive without any increased strain to neck. Baseline: see chart Goal status: MET on 06/20/2022, pt reports that he is able to drive without difficulty  4.  Patient will increase left shoulder and hip strength to at least 5-/5 throughout to allow him to lift objects  without difficulty. Baseline: 4 to 4+/5 Goal status: MET on 06/20/2022  5.  Patient will increase bilateral single leg stance to at least 30 seconds to decrease risk of falling. Baseline: less than 5 sec on each side Goal status: PARTIALLY MET, met initial goal on 05/31/2022, but did not meet updated stretch goal   PLAN:  PT FREQUENCY: 2x/week  PT DURATION: 8 weeks  PLANNED INTERVENTIONS: Therapeutic exercises, Therapeutic activity, Neuromuscular re-education, Balance training, Gait training, Patient/Family education, Self Care, Joint mobilization, Joint manipulation, Aquatic Therapy, Dry Needling, Electrical stimulation, Spinal manipulation, Spinal mobilization, Cryotherapy, Moist heat, Taping, Traction, Ultrasound, Ionotophoresis '4mg'$ /ml Dexamethasone, Manual therapy, and Re-evaluation   PHYSICAL THERAPY DISCHARGE SUMMARY  Patient agrees to discharge. Patient goals were met. Patient is being discharged due to being pleased with the current functional level.    Juel Burrow, PT 06/20/22 1:27 PM  [facility] [address] [place], [place] [address] Phone # [phone] Fax [phone]

## 2022-07-03 ENCOUNTER — Other Ambulatory Visit: Payer: Self-pay | Admitting: Internal Medicine

## 2022-07-03 ENCOUNTER — Other Ambulatory Visit: Payer: Self-pay

## 2022-07-03 ENCOUNTER — Other Ambulatory Visit: Payer: Self-pay | Admitting: Physician Assistant

## 2022-07-03 DIAGNOSIS — F17219 Nicotine dependence, cigarettes, with unspecified nicotine-induced disorders: Secondary | ICD-10-CM

## 2022-07-03 DIAGNOSIS — K8681 Exocrine pancreatic insufficiency: Secondary | ICD-10-CM

## 2022-07-03 DIAGNOSIS — Z122 Encounter for screening for malignant neoplasm of respiratory organs: Secondary | ICD-10-CM

## 2022-07-31 ENCOUNTER — Ambulatory Visit
Admission: RE | Admit: 2022-07-31 | Discharge: 2022-07-31 | Disposition: A | Discharge status: Home / Self Care | Payer: Medicare HMO | Source: Ambulatory Visit | Attending: Physician Assistant | Admitting: Physician Assistant

## 2022-07-31 ENCOUNTER — Ambulatory Visit
Admission: RE | Admit: 2022-07-31 | Discharge: 2022-07-31 | Disposition: A | Discharge status: Home / Self Care | Payer: Medicare HMO | Source: Ambulatory Visit | Attending: Internal Medicine | Admitting: Internal Medicine

## 2022-07-31 DIAGNOSIS — K8681 Exocrine pancreatic insufficiency: Secondary | ICD-10-CM

## 2022-07-31 DIAGNOSIS — Z122 Encounter for screening for malignant neoplasm of respiratory organs: Secondary | ICD-10-CM

## 2022-07-31 DIAGNOSIS — F17219 Nicotine dependence, cigarettes, with unspecified nicotine-induced disorders: Secondary | ICD-10-CM

## 2022-07-31 MED ORDER — IOPAMIDOL (ISOVUE-300) INJECTION 61%
100.0000 mL | Freq: Once | INTRAVENOUS | Status: AC | PRN
  Administered 2022-07-31: 100 mL via INTRAVENOUS

## 2023-12-03 ENCOUNTER — Other Ambulatory Visit: Payer: Self-pay | Admitting: Nurse Practitioner

## 2023-12-03 DIAGNOSIS — E041 Nontoxic single thyroid nodule: Secondary | ICD-10-CM

## 2023-12-05 ENCOUNTER — Ambulatory Visit
Admission: RE | Admit: 2023-12-05 | Discharge: 2023-12-05 | Disposition: A | Discharge status: Home / Self Care | Source: Ambulatory Visit | Attending: Nurse Practitioner | Admitting: Nurse Practitioner

## 2023-12-05 DIAGNOSIS — E041 Nontoxic single thyroid nodule: Secondary | ICD-10-CM
